# Patient Record
Sex: Female | Born: 1955 | Race: White | Hispanic: No | Marital: Single | Smoking: Never smoker
Health system: Southern US, Community
[De-identification: ages and names within clinical notes are randomized; demographics above are authoritative.]

## PROBLEM LIST (undated history)

## (undated) DIAGNOSIS — G119 Hereditary ataxia, unspecified: Secondary | ICD-10-CM

## (undated) DIAGNOSIS — J302 Other seasonal allergic rhinitis: Secondary | ICD-10-CM

## (undated) DIAGNOSIS — M419 Scoliosis, unspecified: Secondary | ICD-10-CM

## (undated) DIAGNOSIS — H3530 Unspecified macular degeneration: Secondary | ICD-10-CM

## (undated) DIAGNOSIS — I10 Essential (primary) hypertension: Secondary | ICD-10-CM

## (undated) DIAGNOSIS — G2581 Restless legs syndrome: Secondary | ICD-10-CM

## (undated) DIAGNOSIS — E039 Hypothyroidism, unspecified: Secondary | ICD-10-CM

## (undated) DIAGNOSIS — M1990 Unspecified osteoarthritis, unspecified site: Secondary | ICD-10-CM

## (undated) DIAGNOSIS — K7460 Unspecified cirrhosis of liver: Secondary | ICD-10-CM

## (undated) DIAGNOSIS — E785 Hyperlipidemia, unspecified: Secondary | ICD-10-CM

## (undated) HISTORY — DX: Hyperlipidemia, unspecified: E78.5

## (undated) HISTORY — DX: Unspecified cirrhosis of liver: K74.60

## (undated) HISTORY — DX: Scoliosis, unspecified: M41.9

## (undated) HISTORY — DX: Hereditary ataxia, unspecified: G11.9

## (undated) HISTORY — DX: Other seasonal allergic rhinitis: J30.2

## (undated) HISTORY — DX: Morbid (severe) obesity due to excess calories: E66.01

## (undated) HISTORY — DX: Unspecified macular degeneration: H35.30

## (undated) HISTORY — DX: Unspecified osteoarthritis, unspecified site: M19.90

## (undated) HISTORY — DX: Restless legs syndrome: G25.81

---

## 2004-03-31 ENCOUNTER — Ambulatory Visit: Payer: Self-pay | Admitting: Internal Medicine

## 2004-05-28 ENCOUNTER — Ambulatory Visit: Payer: Self-pay | Admitting: Internal Medicine

## 2004-07-24 ENCOUNTER — Ambulatory Visit: Payer: Self-pay | Admitting: Internal Medicine

## 2004-11-05 ENCOUNTER — Ambulatory Visit: Payer: Self-pay | Admitting: Internal Medicine

## 2004-12-31 ENCOUNTER — Ambulatory Visit: Payer: Self-pay | Admitting: Internal Medicine

## 2005-02-10 ENCOUNTER — Ambulatory Visit: Payer: Self-pay | Admitting: Internal Medicine

## 2005-05-05 ENCOUNTER — Ambulatory Visit: Payer: Self-pay | Admitting: Internal Medicine

## 2005-05-22 ENCOUNTER — Ambulatory Visit: Payer: Self-pay | Admitting: Internal Medicine

## 2005-08-03 ENCOUNTER — Ambulatory Visit: Payer: Self-pay | Admitting: Internal Medicine

## 2005-10-19 ENCOUNTER — Ambulatory Visit: Payer: Self-pay | Admitting: Internal Medicine

## 2005-12-09 ENCOUNTER — Ambulatory Visit: Payer: Self-pay | Admitting: Internal Medicine

## 2006-01-14 ENCOUNTER — Ambulatory Visit: Payer: Self-pay | Admitting: Internal Medicine

## 2006-01-22 ENCOUNTER — Ambulatory Visit: Payer: Self-pay | Admitting: Internal Medicine

## 2006-04-22 ENCOUNTER — Ambulatory Visit: Payer: Self-pay | Admitting: Internal Medicine

## 2006-04-22 LAB — CONVERTED CEMR LAB
ALT: 38 units/L (ref 0–40)
AST: 26 units/L (ref 0–37)
Albumin: 4.2 g/dL (ref 3.5–5.2)
Alkaline Phosphatase: 75 units/L (ref 39–117)
BUN: 15 mg/dL (ref 6–23)
Basophils Absolute: 0 10*3/uL (ref 0.0–0.1)
Basophils Relative: 0.1 % (ref 0.0–1.0)
CO2: 32 meq/L (ref 19–32)
Calcium: 9.8 mg/dL (ref 8.4–10.5)
Chloride: 97 meq/L (ref 96–112)
Chol/HDL Ratio, serum: 5.2
Cholesterol: 213 mg/dL (ref 0–200)
Creatinine, Ser: 0.9 mg/dL (ref 0.4–1.2)
Eosinophil percent: 3.7 % (ref 0.0–5.0)
GFR calc non Af Amer: 70 mL/min
Glomerular Filtration Rate, Af Am: 85 mL/min/{1.73_m2}
Glucose, Bld: 83 mg/dL (ref 70–99)
HCT: 41.5 % (ref 36.0–46.0)
HDL: 41 mg/dL (ref >39.0)
Hemoglobin: 14.1 g/dL (ref 12.0–15.0)
LDL DIRECT: 151.3 mg/dL
Lymphocytes Relative: 15.6 % (ref 12.0–46.0)
MCHC: 34 g/dL (ref 30.0–36.0)
MCV: 93.3 fL (ref 78.0–100.0)
Monocytes Absolute: 0.5 10*3/uL (ref 0.2–0.7)
Monocytes Relative: 7.2 % (ref 3.0–11.0)
Neutro Abs: 5.4 10*3/uL (ref 1.4–7.7)
Neutrophils Relative %: 73.4 % (ref 43.0–77.0)
Platelets: 263 10*3/uL (ref 150–400)
Potassium: 4.4 meq/L (ref 3.5–5.1)
RBC: 4.45 M/uL (ref 3.87–5.11)
RDW: 14.1 % (ref 11.5–14.6)
Sodium: 140 meq/L (ref 135–145)
Total Bilirubin: 0.6 mg/dL (ref 0.3–1.2)
Total Protein: 7.1 g/dL (ref 6.0–8.3)
Triglyceride fasting, serum: 134 mg/dL (ref 0–149)
VLDL: 27 mg/dL (ref 0–40)
WBC: 7.4 10*3/uL (ref 4.5–10.5)

## 2006-07-14 ENCOUNTER — Ambulatory Visit: Payer: Self-pay | Admitting: Internal Medicine

## 2006-07-21 ENCOUNTER — Encounter: Admission: RE | Admit: 2006-07-21 | Discharge: 2006-09-15 | Payer: Self-pay | Admitting: Internal Medicine

## 2007-08-29 ENCOUNTER — Ambulatory Visit (HOSPITAL_COMMUNITY): Admission: RE | Admit: 2007-08-29 | Discharge: 2007-08-29 | Payer: Self-pay | Admitting: Internal Medicine

## 2007-10-11 ENCOUNTER — Encounter: Admission: RE | Admit: 2007-10-11 | Discharge: 2007-10-11 | Payer: Self-pay | Admitting: Internal Medicine

## 2008-11-26 ENCOUNTER — Ambulatory Visit (HOSPITAL_COMMUNITY): Admission: RE | Admit: 2008-11-26 | Discharge: 2008-11-26 | Payer: Self-pay | Admitting: Internal Medicine

## 2009-11-28 ENCOUNTER — Ambulatory Visit (HOSPITAL_COMMUNITY): Admission: RE | Admit: 2009-11-28 | Discharge: 2009-11-28 | Payer: Self-pay | Admitting: Internal Medicine

## 2010-11-27 ENCOUNTER — Other Ambulatory Visit (HOSPITAL_COMMUNITY): Payer: Self-pay | Admitting: Internal Medicine

## 2010-11-27 DIAGNOSIS — Z1231 Encounter for screening mammogram for malignant neoplasm of breast: Secondary | ICD-10-CM

## 2010-12-10 ENCOUNTER — Ambulatory Visit (HOSPITAL_COMMUNITY)
Admission: RE | Admit: 2010-12-10 | Discharge: 2010-12-10 | Disposition: A | Discharge status: Home / Self Care | Payer: Medicare Other | Source: Ambulatory Visit | Attending: Internal Medicine | Admitting: Internal Medicine

## 2010-12-10 DIAGNOSIS — Z1231 Encounter for screening mammogram for malignant neoplasm of breast: Secondary | ICD-10-CM | POA: Insufficient documentation

## 2011-04-22 DIAGNOSIS — G119 Hereditary ataxia, unspecified: Secondary | ICD-10-CM | POA: Diagnosis not present

## 2011-05-22 DIAGNOSIS — H35349 Macular cyst, hole, or pseudohole, unspecified eye: Secondary | ICD-10-CM | POA: Diagnosis not present

## 2011-08-05 DIAGNOSIS — E039 Hypothyroidism, unspecified: Secondary | ICD-10-CM | POA: Diagnosis not present

## 2011-08-05 DIAGNOSIS — E7800 Pure hypercholesterolemia, unspecified: Secondary | ICD-10-CM | POA: Diagnosis not present

## 2011-08-10 DIAGNOSIS — IMO0001 Reserved for inherently not codable concepts without codable children: Secondary | ICD-10-CM | POA: Diagnosis not present

## 2011-08-10 DIAGNOSIS — M79609 Pain in unspecified limb: Secondary | ICD-10-CM | POA: Diagnosis not present

## 2011-08-10 DIAGNOSIS — E039 Hypothyroidism, unspecified: Secondary | ICD-10-CM | POA: Diagnosis not present

## 2011-08-24 DIAGNOSIS — M79609 Pain in unspecified limb: Secondary | ICD-10-CM | POA: Diagnosis not present

## 2011-08-24 DIAGNOSIS — M719 Bursopathy, unspecified: Secondary | ICD-10-CM | POA: Diagnosis not present

## 2011-09-02 ENCOUNTER — Ambulatory Visit: Payer: Medicare Other | Attending: Internal Medicine | Admitting: Physical Therapy

## 2011-09-02 DIAGNOSIS — M25619 Stiffness of unspecified shoulder, not elsewhere classified: Secondary | ICD-10-CM | POA: Diagnosis not present

## 2011-09-02 DIAGNOSIS — M25519 Pain in unspecified shoulder: Secondary | ICD-10-CM | POA: Insufficient documentation

## 2011-09-02 DIAGNOSIS — IMO0001 Reserved for inherently not codable concepts without codable children: Secondary | ICD-10-CM | POA: Diagnosis not present

## 2011-09-09 ENCOUNTER — Encounter: Payer: Medicare Other | Admitting: Physical Therapy

## 2011-09-10 ENCOUNTER — Encounter: Payer: Medicare Other | Admitting: Rehabilitation

## 2011-11-03 DIAGNOSIS — M79609 Pain in unspecified limb: Secondary | ICD-10-CM | POA: Diagnosis not present

## 2011-11-03 DIAGNOSIS — M67919 Unspecified disorder of synovium and tendon, unspecified shoulder: Secondary | ICD-10-CM | POA: Diagnosis not present

## 2011-11-03 DIAGNOSIS — M719 Bursopathy, unspecified: Secondary | ICD-10-CM | POA: Diagnosis not present

## 2011-11-04 DIAGNOSIS — E039 Hypothyroidism, unspecified: Secondary | ICD-10-CM | POA: Diagnosis not present

## 2011-11-11 DIAGNOSIS — I10 Essential (primary) hypertension: Secondary | ICD-10-CM | POA: Diagnosis not present

## 2011-11-11 DIAGNOSIS — E039 Hypothyroidism, unspecified: Secondary | ICD-10-CM | POA: Diagnosis not present

## 2011-11-11 DIAGNOSIS — M79609 Pain in unspecified limb: Secondary | ICD-10-CM | POA: Diagnosis not present

## 2011-11-24 DIAGNOSIS — H35349 Macular cyst, hole, or pseudohole, unspecified eye: Secondary | ICD-10-CM | POA: Diagnosis not present

## 2011-12-08 DIAGNOSIS — H3560 Retinal hemorrhage, unspecified eye: Secondary | ICD-10-CM | POA: Diagnosis not present

## 2011-12-08 DIAGNOSIS — H35349 Macular cyst, hole, or pseudohole, unspecified eye: Secondary | ICD-10-CM | POA: Diagnosis not present

## 2011-12-08 DIAGNOSIS — H43819 Vitreous degeneration, unspecified eye: Secondary | ICD-10-CM | POA: Diagnosis not present

## 2011-12-08 DIAGNOSIS — H2510 Age-related nuclear cataract, unspecified eye: Secondary | ICD-10-CM | POA: Diagnosis not present

## 2011-12-23 DIAGNOSIS — Z23 Encounter for immunization: Secondary | ICD-10-CM | POA: Diagnosis not present

## 2012-01-25 DIAGNOSIS — H35349 Macular cyst, hole, or pseudohole, unspecified eye: Secondary | ICD-10-CM | POA: Diagnosis not present

## 2012-01-26 DIAGNOSIS — H35349 Macular cyst, hole, or pseudohole, unspecified eye: Secondary | ICD-10-CM | POA: Diagnosis not present

## 2012-03-03 DIAGNOSIS — I10 Essential (primary) hypertension: Secondary | ICD-10-CM | POA: Diagnosis not present

## 2012-03-03 DIAGNOSIS — E039 Hypothyroidism, unspecified: Secondary | ICD-10-CM | POA: Diagnosis not present

## 2012-03-16 DIAGNOSIS — H35349 Macular cyst, hole, or pseudohole, unspecified eye: Secondary | ICD-10-CM | POA: Diagnosis not present

## 2012-04-18 DIAGNOSIS — M79609 Pain in unspecified limb: Secondary | ICD-10-CM | POA: Diagnosis not present

## 2012-04-18 DIAGNOSIS — I10 Essential (primary) hypertension: Secondary | ICD-10-CM | POA: Diagnosis not present

## 2012-04-18 DIAGNOSIS — E039 Hypothyroidism, unspecified: Secondary | ICD-10-CM | POA: Diagnosis not present

## 2012-05-06 DIAGNOSIS — H2510 Age-related nuclear cataract, unspecified eye: Secondary | ICD-10-CM | POA: Diagnosis not present

## 2012-10-11 DIAGNOSIS — I10 Essential (primary) hypertension: Secondary | ICD-10-CM | POA: Diagnosis not present

## 2012-10-11 DIAGNOSIS — E039 Hypothyroidism, unspecified: Secondary | ICD-10-CM | POA: Diagnosis not present

## 2012-10-25 DIAGNOSIS — E039 Hypothyroidism, unspecified: Secondary | ICD-10-CM | POA: Diagnosis not present

## 2012-10-25 DIAGNOSIS — J309 Allergic rhinitis, unspecified: Secondary | ICD-10-CM | POA: Diagnosis not present

## 2012-10-25 DIAGNOSIS — I10 Essential (primary) hypertension: Secondary | ICD-10-CM | POA: Diagnosis not present

## 2012-11-08 DIAGNOSIS — H2510 Age-related nuclear cataract, unspecified eye: Secondary | ICD-10-CM | POA: Diagnosis not present

## 2012-12-30 DIAGNOSIS — Z23 Encounter for immunization: Secondary | ICD-10-CM | POA: Diagnosis not present

## 2013-03-03 ENCOUNTER — Emergency Department: Payer: Self-pay | Admitting: Internal Medicine

## 2013-03-03 DIAGNOSIS — G1111 Friedreich ataxia: Secondary | ICD-10-CM | POA: Diagnosis not present

## 2013-03-03 DIAGNOSIS — I998 Other disorder of circulatory system: Secondary | ICD-10-CM | POA: Diagnosis not present

## 2013-03-03 LAB — APTT: Activated PTT: 31.6 secs (ref 23.6–35.9)

## 2013-03-03 LAB — CBC
MCH: 33.4 pg (ref 26.0–34.0)
MCV: 97 fL (ref 80–100)
RBC: 4.38 10*6/uL (ref 3.80–5.20)

## 2013-04-27 DIAGNOSIS — E039 Hypothyroidism, unspecified: Secondary | ICD-10-CM | POA: Diagnosis not present

## 2013-04-27 DIAGNOSIS — I10 Essential (primary) hypertension: Secondary | ICD-10-CM | POA: Diagnosis not present

## 2013-05-04 DIAGNOSIS — E7800 Pure hypercholesterolemia, unspecified: Secondary | ICD-10-CM | POA: Diagnosis not present

## 2013-05-04 DIAGNOSIS — E039 Hypothyroidism, unspecified: Secondary | ICD-10-CM | POA: Diagnosis not present

## 2013-05-04 DIAGNOSIS — I10 Essential (primary) hypertension: Secondary | ICD-10-CM | POA: Diagnosis not present

## 2013-10-25 DIAGNOSIS — E039 Hypothyroidism, unspecified: Secondary | ICD-10-CM | POA: Diagnosis not present

## 2013-10-25 DIAGNOSIS — I10 Essential (primary) hypertension: Secondary | ICD-10-CM | POA: Diagnosis not present

## 2013-11-08 DIAGNOSIS — K769 Liver disease, unspecified: Secondary | ICD-10-CM | POA: Diagnosis not present

## 2013-11-08 DIAGNOSIS — I10 Essential (primary) hypertension: Secondary | ICD-10-CM | POA: Diagnosis not present

## 2013-11-08 DIAGNOSIS — E039 Hypothyroidism, unspecified: Secondary | ICD-10-CM | POA: Diagnosis not present

## 2013-11-08 DIAGNOSIS — M25569 Pain in unspecified knee: Secondary | ICD-10-CM | POA: Diagnosis not present

## 2013-11-17 DIAGNOSIS — H2510 Age-related nuclear cataract, unspecified eye: Secondary | ICD-10-CM | POA: Diagnosis not present

## 2013-11-17 DIAGNOSIS — H353190 Nonexudative age-related macular degeneration, unspecified eye, stage unspecified: Secondary | ICD-10-CM | POA: Diagnosis not present

## 2014-01-02 DIAGNOSIS — E039 Hypothyroidism, unspecified: Secondary | ICD-10-CM | POA: Diagnosis not present

## 2014-01-16 DIAGNOSIS — E039 Hypothyroidism, unspecified: Secondary | ICD-10-CM | POA: Diagnosis not present

## 2014-01-16 DIAGNOSIS — Z23 Encounter for immunization: Secondary | ICD-10-CM | POA: Diagnosis not present

## 2014-01-16 DIAGNOSIS — I10 Essential (primary) hypertension: Secondary | ICD-10-CM | POA: Diagnosis not present

## 2014-01-23 DIAGNOSIS — M25561 Pain in right knee: Secondary | ICD-10-CM | POA: Diagnosis not present

## 2014-01-23 DIAGNOSIS — M179 Osteoarthritis of knee, unspecified: Secondary | ICD-10-CM | POA: Diagnosis not present

## 2014-08-28 ENCOUNTER — Other Ambulatory Visit: Payer: Self-pay | Admitting: Internal Medicine

## 2014-08-28 DIAGNOSIS — R945 Abnormal results of liver function studies: Secondary | ICD-10-CM

## 2014-09-18 ENCOUNTER — Ambulatory Visit
Admission: RE | Admit: 2014-09-18 | Discharge: 2014-09-18 | Disposition: A | Discharge status: Home / Self Care | Payer: Medicare Other | Source: Ambulatory Visit | Attending: Internal Medicine | Admitting: Internal Medicine

## 2014-09-18 DIAGNOSIS — R160 Hepatomegaly, not elsewhere classified: Secondary | ICD-10-CM | POA: Diagnosis not present

## 2014-09-18 DIAGNOSIS — R945 Abnormal results of liver function studies: Secondary | ICD-10-CM | POA: Diagnosis present

## 2014-10-03 ENCOUNTER — Ambulatory Visit: Payer: Medicare Other | Admitting: Cardiovascular Disease

## 2014-11-09 ENCOUNTER — Ambulatory Visit: Payer: Medicare Other | Admitting: Cardiovascular Disease

## 2016-04-22 DIAGNOSIS — R0601 Orthopnea: Secondary | ICD-10-CM | POA: Diagnosis not present

## 2016-04-22 DIAGNOSIS — R270 Ataxia, unspecified: Secondary | ICD-10-CM | POA: Diagnosis not present

## 2016-04-22 DIAGNOSIS — Q043 Other reduction deformities of brain: Secondary | ICD-10-CM | POA: Diagnosis not present

## 2016-04-22 DIAGNOSIS — R42 Dizziness and giddiness: Secondary | ICD-10-CM | POA: Diagnosis not present

## 2016-05-23 DIAGNOSIS — R270 Ataxia, unspecified: Secondary | ICD-10-CM | POA: Diagnosis not present

## 2016-05-23 DIAGNOSIS — Q043 Other reduction deformities of brain: Secondary | ICD-10-CM | POA: Diagnosis not present

## 2016-05-23 DIAGNOSIS — R42 Dizziness and giddiness: Secondary | ICD-10-CM | POA: Diagnosis not present

## 2016-05-23 DIAGNOSIS — R0601 Orthopnea: Secondary | ICD-10-CM | POA: Diagnosis not present

## 2016-06-08 DIAGNOSIS — Z6841 Body Mass Index (BMI) 40.0 and over, adult: Secondary | ICD-10-CM | POA: Diagnosis not present

## 2016-06-08 DIAGNOSIS — G119 Hereditary ataxia, unspecified: Secondary | ICD-10-CM | POA: Diagnosis not present

## 2016-06-20 DIAGNOSIS — R0601 Orthopnea: Secondary | ICD-10-CM | POA: Diagnosis not present

## 2016-06-20 DIAGNOSIS — Q043 Other reduction deformities of brain: Secondary | ICD-10-CM | POA: Diagnosis not present

## 2016-06-20 DIAGNOSIS — R42 Dizziness and giddiness: Secondary | ICD-10-CM | POA: Diagnosis not present

## 2016-06-20 DIAGNOSIS — R270 Ataxia, unspecified: Secondary | ICD-10-CM | POA: Diagnosis not present

## 2016-07-21 DIAGNOSIS — R0601 Orthopnea: Secondary | ICD-10-CM | POA: Diagnosis not present

## 2016-07-21 DIAGNOSIS — Q043 Other reduction deformities of brain: Secondary | ICD-10-CM | POA: Diagnosis not present

## 2016-07-21 DIAGNOSIS — R42 Dizziness and giddiness: Secondary | ICD-10-CM | POA: Diagnosis not present

## 2016-07-21 DIAGNOSIS — R270 Ataxia, unspecified: Secondary | ICD-10-CM | POA: Diagnosis not present

## 2016-07-22 ENCOUNTER — Emergency Department: Payer: PPO

## 2016-07-22 ENCOUNTER — Encounter: Payer: Self-pay | Admitting: Emergency Medicine

## 2016-07-22 ENCOUNTER — Emergency Department
Admission: EM | Admit: 2016-07-22 | Discharge: 2016-07-23 | Disposition: A | Discharge status: Home / Self Care | Payer: PPO | Attending: Emergency Medicine | Admitting: Emergency Medicine

## 2016-07-22 DIAGNOSIS — S3991XA Unspecified injury of abdomen, initial encounter: Secondary | ICD-10-CM | POA: Diagnosis not present

## 2016-07-22 DIAGNOSIS — Y939 Activity, unspecified: Secondary | ICD-10-CM | POA: Insufficient documentation

## 2016-07-22 DIAGNOSIS — S300XXA Contusion of lower back and pelvis, initial encounter: Secondary | ICD-10-CM | POA: Insufficient documentation

## 2016-07-22 DIAGNOSIS — R0602 Shortness of breath: Secondary | ICD-10-CM | POA: Diagnosis not present

## 2016-07-22 DIAGNOSIS — E039 Hypothyroidism, unspecified: Secondary | ICD-10-CM | POA: Insufficient documentation

## 2016-07-22 DIAGNOSIS — Y92002 Bathroom of unspecified non-institutional (private) residence single-family (private) house as the place of occurrence of the external cause: Secondary | ICD-10-CM | POA: Diagnosis not present

## 2016-07-22 DIAGNOSIS — M25551 Pain in right hip: Secondary | ICD-10-CM | POA: Diagnosis not present

## 2016-07-22 DIAGNOSIS — I10 Essential (primary) hypertension: Secondary | ICD-10-CM | POA: Insufficient documentation

## 2016-07-22 DIAGNOSIS — R270 Ataxia, unspecified: Secondary | ICD-10-CM | POA: Diagnosis not present

## 2016-07-22 DIAGNOSIS — W19XXXA Unspecified fall, initial encounter: Secondary | ICD-10-CM

## 2016-07-22 DIAGNOSIS — S3992XA Unspecified injury of lower back, initial encounter: Secondary | ICD-10-CM | POA: Diagnosis not present

## 2016-07-22 DIAGNOSIS — W1839XA Other fall on same level, initial encounter: Secondary | ICD-10-CM | POA: Insufficient documentation

## 2016-07-22 DIAGNOSIS — Y999 Unspecified external cause status: Secondary | ICD-10-CM | POA: Insufficient documentation

## 2016-07-22 DIAGNOSIS — R000 Tachycardia, unspecified: Secondary | ICD-10-CM | POA: Diagnosis not present

## 2016-07-22 DIAGNOSIS — T148XXA Other injury of unspecified body region, initial encounter: Secondary | ICD-10-CM | POA: Diagnosis not present

## 2016-07-22 DIAGNOSIS — R296 Repeated falls: Secondary | ICD-10-CM | POA: Diagnosis not present

## 2016-07-22 DIAGNOSIS — Z79899 Other long term (current) drug therapy: Secondary | ICD-10-CM | POA: Insufficient documentation

## 2016-07-22 HISTORY — DX: Hypothyroidism, unspecified: E03.9

## 2016-07-22 HISTORY — DX: Essential (primary) hypertension: I10

## 2016-07-22 LAB — TROPONIN I

## 2016-07-22 LAB — URINALYSIS, COMPLETE (UACMP) WITH MICROSCOPIC
BILIRUBIN URINE: NEGATIVE
Bacteria, UA: NONE SEEN
GLUCOSE, UA: NEGATIVE mg/dL
Hgb urine dipstick: NEGATIVE
Ketones, ur: NEGATIVE mg/dL
LEUKOCYTES UA: NEGATIVE
Nitrite: NEGATIVE
PH: 6 (ref 5.0–8.0)
Protein, ur: NEGATIVE mg/dL
SQUAMOUS EPITHELIAL / LPF: NONE SEEN

## 2016-07-22 LAB — BASIC METABOLIC PANEL
ANION GAP: 8 (ref 5–15)
BUN: 17 mg/dL (ref 6–20)
CHLORIDE: 103 mmol/L (ref 101–111)
CO2: 26 mmol/L (ref 22–32)
Calcium: 8.9 mg/dL (ref 8.9–10.3)
Creatinine, Ser: 1.06 mg/dL — ABNORMAL HIGH (ref 0.44–1.00)
GFR, EST NON AFRICAN AMERICAN: 55 mL/min — AB (ref >60)
Glucose, Bld: 125 mg/dL — ABNORMAL HIGH (ref 65–99)
POTASSIUM: 3.9 mmol/L (ref 3.5–5.1)
SODIUM: 137 mmol/L (ref 135–145)

## 2016-07-22 LAB — CBC
HCT: 36.3 % (ref 35.0–47.0)
HEMOGLOBIN: 12.5 g/dL (ref 12.0–16.0)
MCH: 33.9 pg (ref 26.0–34.0)
MCHC: 34.5 g/dL (ref 32.0–36.0)
MCV: 98.4 fL (ref 80.0–100.0)
PLATELETS: 178 10*3/uL (ref 150–440)
RBC: 3.69 MIL/uL — AB (ref 3.80–5.20)
RDW: 14.1 % (ref 11.5–14.5)
WBC: 9.1 10*3/uL (ref 3.6–11.0)

## 2016-07-22 MED ORDER — LORAZEPAM 2 MG/ML IJ SOLN
1.0000 mg | Freq: Once | INTRAMUSCULAR | Status: AC
  Administered 2016-07-22: 1 mg via INTRAVENOUS
  Filled 2016-07-22: qty 1

## 2016-07-22 MED ORDER — SODIUM CHLORIDE 0.9 % IV BOLUS (SEPSIS)
1000.0000 mL | Freq: Once | INTRAVENOUS | Status: AC
  Administered 2016-07-22: 1000 mL via INTRAVENOUS

## 2016-07-22 MED ORDER — ACETAMINOPHEN 500 MG PO TABS: 1000.0000 mg | ORAL_TABLET | Freq: Three times a day (TID) | ORAL | Status: DC | PRN

## 2016-07-22 MED ORDER — LOSARTAN POTASSIUM 50 MG PO TABS
100.0000 mg | ORAL_TABLET | Freq: Every day | ORAL | Status: DC
  Administered 2016-07-23: 100 mg via ORAL
  Filled 2016-07-22: qty 2

## 2016-07-22 MED ORDER — LEVOTHYROXINE SODIUM 112 MCG PO TABS
112.0000 ug | ORAL_TABLET | Freq: Every day | ORAL | Status: DC
  Administered 2016-07-23: 112 ug via ORAL
  Filled 2016-07-22: qty 1

## 2016-07-22 MED ORDER — IOPAMIDOL (ISOVUE-300) INJECTION 61%
100.0000 mL | Freq: Once | INTRAVENOUS | Status: AC | PRN
  Administered 2016-07-22: 100 mL via INTRAVENOUS

## 2016-07-22 MED ORDER — ONDANSETRON HCL 4 MG/2ML IJ SOLN
4.0000 mg | Freq: Once | INTRAMUSCULAR | Status: AC
  Administered 2016-07-22: 4 mg via INTRAVENOUS
  Filled 2016-07-22: qty 2

## 2016-07-22 MED ORDER — HYDROCHLOROTHIAZIDE 25 MG PO TABS
50.0000 mg | ORAL_TABLET | Freq: Every day | ORAL | Status: DC
  Administered 2016-07-23: 50 mg via ORAL
  Filled 2016-07-22: qty 2

## 2016-07-22 NOTE — ED Provider Notes (Signed)
[facility] Emergency Department Provider Note  ____________________________________________  Time seen: Approximately 3:21 PM  I have reviewed the triage vital signs and the nursing notes.   HISTORY  Chief Complaint Failure To Thrive   HPI Olivia Butler is a 61 y.o. female with a history of Hashimoto thyroiditis and degenerative cerebellar ataxia who presents for evaluation of a fall. Patient reports she lives alone. Her ataxia is been getting progressively worse over the course of months. She has a walker and a wheelchair but has had increased number of falls. She reports that she has been falling every day. Today she had a fall in the bathroom where she fell onto her buttock. She was able to get herself into the wheelchair. She then went to the senior center and upon getting there she started feeling worsening pain. Patient is concerned that she will seriously hurt herself pretty soon and does not feel safe leaving at home by herself anymore.Patient has no family. Patient denies head trauma, neck pain, back pain, chest pain, abdominal pain, extremity pain.   Past Medical History:  Diagnosis Date  . Hypertension   . Hypothyroidism     There are no active problems to display for this patient.   No past surgical history on file.  Prior to Admission medications   Not on File    Allergies Patient has no known allergies.  No family history on file.  Social History Social History  Substance Use Topics  . Smoking status: Never Smoker  . Smokeless tobacco: Never Used  . Alcohol use No    Review of Systems Constitutional: Negative for fever. Eyes: Negative for visual changes. ENT: Negative for facial injury or neck injury Cardiovascular: Negative for chest injury. Respiratory: Negative for shortness of breath. Negative for chest wall injury. Gastrointestinal: Negative for abdominal pain or injury. Genitourinary: Negative for  dysuria. Musculoskeletal: Negative for back injury, negative for arm or leg pain. + R buttock pain Skin: Negative for laceration/abrasions. Neurological: Negative for head injury. + ataxia   ____________________________________________   PHYSICAL EXAM:  VITAL SIGNS: ED Triage Vitals  Enc Vitals Group     BP --      Pulse Rate 07/22/16 1519 (!) 106     Resp 07/22/16 1519 16     Temp 07/22/16 1519 97.6 F (36.4 C)     Temp Source 07/22/16 1519 Oral     SpO2 --      Weight 07/22/16 1521 285 lb (129.3 kg)     Height 07/22/16 1521  (1.702 m)     Head Circumference --      Peak Flow --      Pain Score 07/22/16 1519 10     Pain Loc --      Pain Edu? --      Excl. in GC? --    Constitutional: Alert and oriented. No acute distress. Does not appear intoxicated. HEENT Head: Normocephalic and atraumatic. Face: No facial bony tenderness. Stable midface Ears: No hemotympanum bilaterally. No Battle sign Eyes: No eye injury. PERRL. No raccoon eyes Nose: Nontender. No epistaxis. No rhinorrhea Mouth/Throat: Mucous membranes are moist. No oropharyngeal blood. No dental injury. Airway patent without stridor. Normal voice. Neck: no C-collar in place. No midline c-spine tenderness.  Cardiovascular: Normal rate, regular rhythm. Normal and symmetric distal pulses are present in all extremities. Pulmonary/Chest: Chest wall is stable and nontender to palpation/compression. Normal respiratory effort. Breath sounds are normal. No crepitus.  Abdominal: Soft, nontender,  non distended. Musculoskeletal: Large R sided buttock hematoma and swelling with significant ttp. Nontender with normal full range of motion in all extremities. No deformities. No thoracic or lumbar midline spinal tenderness. Pelvis is stable. Skin: Skin is warm, dry and intact. No abrasions or contutions. Psychiatric: Speech and behavior are appropriate. Neurological: Normal speech and language. Moves all extremities to command.  No gross focal neurologic deficits are appreciated.  Glascow Coma Score: 4 - Opens eyes on own 6 - Follows simple motor commands 5 - Alert and oriented GCS: 15   ____________________________________________   LABS (all labs ordered are listed, but only abnormal results are displayed)  Labs Reviewed  CBC - Abnormal; Notable for the following:       Result Value   RBC 3.69 (*)    All other components within normal limits  BASIC METABOLIC PANEL - Abnormal; Notable for the following:    Glucose, Bld 125 (*)    Creatinine, Ser 1.06 (*)    GFR calc non Af Amer 55 (*)    All other components within normal limits  URINALYSIS, COMPLETE (UACMP) WITH MICROSCOPIC - Abnormal; Notable for the following:    Color, Urine YELLOW (*)    APPearance CLEAR (*)    Specific Gravity, Urine >1.046 (*)    All other components within normal limits  TROPONIN I   ____________________________________________  EKG  ED ECG REPORT I, Nita Sickle, the attending physician, personally viewed and interpreted this ECG.   Sinus tachycardia, rate of 103, normal intervals, normal axis, no ST elevations or depressions. No prior for comparison ____________________________________________  RADIOLOGY  CT head: Generalized atrophy particularly of the cerebellum. Minimal small vessel chronic ischemic changes of deep cerebral white matter. No acute intracranial abnormalities.  CT a/p: CT ABDOMEN AND PELVIS IMPRESSION: 1. 5.8 x 10.3 x 10.0 cm soft tissue hematoma within the subcutaneous fat of the right gluteal region. 2. No other acute traumatic injury within the abdomen and pelvis. 3. Cirrhosis with evidence of portal hypertension. No ascites. 4. Mildly enlarged intra-abdominal and pelvic adenopathy as above, indeterminate, but may be reactive. 5. Cholelithiasis. 6. Bilateral nonobstructive nephrolithiasis. 7. Moderate aorto bi-iliac atherosclerotic disease. 8. Fat containing paraumbilical hernia  without associated inflammation.  CT LUMBAR SPINE IMPRESSION: 1. No acute traumatic injury within the lumbar spine. 2. Scoliosis with mild multilevel degenerative spondylolysis as above.  CXR: No edema or consolidation. _______________________________________   PROCEDURES  Procedure(s) performed: None Procedures Critical Care performed:  None ____________________________________________   INITIAL IMPRESSION / ASSESSMENT AND PLAN / ED COURSE  61 y.o. female with a history of Hashimoto thyroiditis and degenerative cerebellar ataxia who presents for evaluation of increasing fall frequency due to her ataxia. Patient lives alone. Has walker and wheelchair. Complaining of R buttock region where patient has a very large hematoma. Will get head CT, CT pelvis, CT lumbar spine. Will check basic labs. Presentation probably due to progression of her chronic illness however will eval labs and imaging to rule out alternative diagnosis leading to increasing fall frequency.   Clinical Course as of Jul 23 1815  Wed Jul 22, 2016  1812 Imaging showing a large buttock hematoma with no active bleeding. No other acute injuries sustained during the fall. Blood work within normal limits showing mildly elevated creatinine. Patient was given IV fluids. I discussed patient with social work and based on patient's insurance she may qualify for placement out of the emergency department. They recommended physical therapy evaluation which will happen in the morning. Patient  will wait for those evaluations in the emergency department.  [CV]    Clinical Course User Index [CV] Nita Sickle, MD    Pertinent labs & imaging results that were available during my care of the patient were reviewed by me and considered in my medical decision making (see chart for details).    ____________________________________________   FINAL CLINICAL IMPRESSION(S) / ED DIAGNOSES  Final diagnoses:  Fall, initial encounter   Traumatic hematoma of buttock, initial encounter      NEW MEDICATIONS STARTED DURING THIS VISIT:  New Prescriptions   No medications on file     Note:  This document was prepared using Dragon voice recognition software and may include unintentional dictation errors.    Nita Sickle, MD 07/22/16 [phone]

## 2016-07-22 NOTE — ED Notes (Signed)
Hooked pt up to monitor.

## 2016-07-22 NOTE — ED Notes (Signed)
Patient unable to tolerate CT at this time. See orders. Will place patient on 2L [place] O2 for comfort.

## 2016-07-22 NOTE — ED Notes (Signed)
Gave pt food tray and ginger ale 

## 2016-07-22 NOTE — ED Notes (Signed)
Patient transported to X-ray 

## 2016-07-22 NOTE — ED Triage Notes (Signed)
Per [facility] patient comes from home. Patient lives alone. Patient here for frequent falls, SOB and dizziness x 1 month. A&O x4.

## 2016-07-22 NOTE — ED Notes (Signed)
Gave pt a blanket

## 2016-07-22 NOTE — ED Notes (Signed)
Patient transported to CT 

## 2016-07-23 DIAGNOSIS — E7800 Pure hypercholesterolemia, unspecified: Secondary | ICD-10-CM | POA: Diagnosis not present

## 2016-07-23 DIAGNOSIS — G20 Parkinson's disease: Secondary | ICD-10-CM | POA: Diagnosis not present

## 2016-07-23 DIAGNOSIS — M419 Scoliosis, unspecified: Secondary | ICD-10-CM | POA: Diagnosis not present

## 2016-07-23 DIAGNOSIS — G3281 Cerebellar ataxia in diseases classified elsewhere: Secondary | ICD-10-CM | POA: Diagnosis not present

## 2016-07-23 DIAGNOSIS — K7460 Unspecified cirrhosis of liver: Secondary | ICD-10-CM | POA: Diagnosis not present

## 2016-07-23 DIAGNOSIS — S300XXA Contusion of lower back and pelvis, initial encounter: Secondary | ICD-10-CM | POA: Diagnosis not present

## 2016-07-23 DIAGNOSIS — E063 Autoimmune thyroiditis: Secondary | ICD-10-CM | POA: Diagnosis not present

## 2016-07-23 DIAGNOSIS — K7469 Other cirrhosis of liver: Secondary | ICD-10-CM | POA: Diagnosis not present

## 2016-07-23 DIAGNOSIS — H3530 Unspecified macular degeneration: Secondary | ICD-10-CM | POA: Diagnosis not present

## 2016-07-23 DIAGNOSIS — Z7401 Bed confinement status: Secondary | ICD-10-CM | POA: Diagnosis not present

## 2016-07-23 DIAGNOSIS — W19XXXD Unspecified fall, subsequent encounter: Secondary | ICD-10-CM | POA: Diagnosis not present

## 2016-07-23 DIAGNOSIS — M4309 Spondylolysis, multiple sites in spine: Secondary | ICD-10-CM | POA: Diagnosis not present

## 2016-07-23 DIAGNOSIS — E038 Other specified hypothyroidism: Secondary | ICD-10-CM | POA: Diagnosis not present

## 2016-07-23 DIAGNOSIS — R6889 Other general symptoms and signs: Secondary | ICD-10-CM | POA: Diagnosis not present

## 2016-07-23 DIAGNOSIS — G119 Hereditary ataxia, unspecified: Secondary | ICD-10-CM | POA: Diagnosis not present

## 2016-07-23 DIAGNOSIS — R296 Repeated falls: Secondary | ICD-10-CM | POA: Diagnosis not present

## 2016-07-23 DIAGNOSIS — S300XXD Contusion of lower back and pelvis, subsequent encounter: Secondary | ICD-10-CM | POA: Diagnosis not present

## 2016-07-23 DIAGNOSIS — I10 Essential (primary) hypertension: Secondary | ICD-10-CM | POA: Diagnosis not present

## 2016-07-23 DIAGNOSIS — R627 Adult failure to thrive: Secondary | ICD-10-CM | POA: Diagnosis not present

## 2016-07-23 DIAGNOSIS — M1712 Unilateral primary osteoarthritis, left knee: Secondary | ICD-10-CM | POA: Diagnosis not present

## 2016-07-23 MED ORDER — LORAZEPAM 0.5 MG PO TABS
0.5000 mg | ORAL_TABLET | Freq: Once | ORAL | Status: AC
  Administered 2016-07-23: 0.5 mg via ORAL

## 2016-07-23 MED ORDER — LORAZEPAM 0.5 MG PO TABS
ORAL_TABLET | ORAL | Status: AC
  Administered 2016-07-23: 0.5 mg via ORAL
  Filled 2016-07-23: qty 1

## 2016-07-23 NOTE — ED Notes (Signed)
Pt placed on bedpan

## 2016-07-23 NOTE — ED Provider Notes (Signed)
Patient accepted at [facility]  Current Facility-Administered Medications:  .  acetaminophen (TYLENOL) tablet 1,000 mg, 1,000 mg, Oral, Q8H PRN, Nita Sickle, MD .  hydrochlorothiazide (HYDRODIURIL) tablet 50 mg, 50 mg, Oral, Daily, Nita Sickle, MD, 50 mg at 07/23/16 1053 .  levothyroxine (SYNTHROID, LEVOTHROID) tablet 112 mcg, 112 mcg, Oral, QAC breakfast, Nita Sickle, MD, 112 mcg at 07/23/16 0835 .  losartan (COZAAR) tablet 100 mg, 100 mg, Oral, Daily, Nita Sickle, MD, 100 mg at 07/23/16 1053  Current Outpatient Prescriptions:  .  hydrochlorothiazide (HYDRODIURIL) 25 MG tablet, Take 50 mg by mouth daily., Disp: , Rfl:  .  levothyroxine (SYNTHROID, LEVOTHROID) 112 MCG tablet, Take 112 mcg by mouth daily., Disp: , Rfl:  .  losartan (COZAAR) 100 MG tablet, Take 100 mg by mouth daily., Disp: , Rfl:  .  naproxen (NAPROSYN) 500 MG tablet, Take 500 mg by mouth 2 (two) times daily with a meal., Disp: , Rfl:  .  prochlorperazine (COMPAZINE) 10 MG tablet, Take 10 mg by mouth every 8 (eight) hours as needed for nausea., Disp: , Rfl:  .  rOPINIRole (REQUIP) 1 MG tablet, Take 1 mg by mouth 2 (two) times daily., Disp: , Rfl:  .  vitamin E 400 UNIT capsule, Take 400 Units by mouth daily., Disp: , Rfl:    She is stable for discharge   Emily Filbert, MD 07/23/16 1531

## 2016-07-23 NOTE — Clinical Social Work Note (Signed)
CSW consulted for New SNF. CSW met  with pt to address consult. CSW introduced herself and explained role of social work. CSW also explained explained the process of discharging to SNF. Pt is agreeable to STR SNF placement, as recommending by PT. SNF search has been initiated. Will follow up with bed offers. Will initiate insurance auth. MD and RN updated. CSW will continue to follow.   Darden Dates, MSW, LCSW  Clinical Social Worker  [phone]

## 2016-07-23 NOTE — Clinical Social Work Note (Signed)
Pt is ready for discharge today and will to [facility]. [employer] Auth:  [id number]. Pt and friend, Karena Addison, are aware and agreeable to discharge. Facility has received discharge information. RN will call report. [facility] will provide transportation. CSW is signing off as no further needs identified.   Dede Query, MSW, LCSW Clinical Social Worker  [phone]

## 2016-07-23 NOTE — Care Management Note (Signed)
Case Management Note  Patient Details  Name: Olivia Butler MRN: [id number] Date of Birth: December 23, 1955  Subjective/Objective:        Spoke to patient per MD request. I also spoke to [name] [phone]   . She is the person who helps take care of this lady, and is homebound right now after having knee surgery. She gave me the patient insurance inf, which I asked patient access to add to the chart.She has a Health visitor that is active. We are awaiting CSW.         Action/Plan:   Expected Discharge Date:                  Expected Discharge Plan:     In-House Referral:     Discharge planning Services     Post Acute Care Choice:    Choice offered to:     DME Arranged:    DME Agency:     HH Arranged:    HH Agency:     Status of Service:     If discussed at [employer] of Stay Meetings, dates discussed:    Additional Comments:  Berna Bue, RN 07/23/2016, 10:20 AM

## 2016-07-23 NOTE — ED Notes (Signed)
Secretary given medical necessity and discharge paperwork.  EMS transportation to be called for patient transport to [facility].

## 2016-07-23 NOTE — Evaluation (Deleted)
Physical Therapy Evaluation Patient Details Name: Olivia Butler MRN: [id number] DOB: 1955/09/03 Today's Date: 07/23/2016   History of Present Illness  61 y.o. female with a history of Hashimoto thyroiditis and degenerative cerebellar ataxia who presents for evaluation of a fall. Patient reports she lives alone. Her ataxia is been getting progressively worse over the course of months. She has a walker and a wheelchair but has had increased number of falls. She reports that she has been falling every day  Clinical Impression  Pt showed good effort and willingness to participate but was highly unsafe with attempt at standing and had severe buckling and unsteadiness getting into standing.  She is able to get to sitting EOB w/o direct assist, but ultimately is limited to this.  She reports she falls nearly every time she tries to get to her feet or transfer (onto w/c, commode, etc). Pt admits that she probably has not been safe a home for a while now and is eager to work with PT at rehab to get stronger but also realizes that she may ultimately need a higher level of care given her falls, progressing ataxia and functional limitations.     Follow Up Recommendations SNF    Equipment Recommendations       Recommendations for Other Services       Precautions / Restrictions Precautions Precautions: Fall Restrictions Weight Bearing Restrictions: No      Mobility  Bed Mobility Overal bed mobility: Modified Independent             General bed mobility comments: Pt did well getting herself to sitting EOB with rail use and extra time, but no addiditional assist  Transfers Overall transfer level: Needs assistance Equipment used: Rolling walker (2 wheeled) Transfers: Sit to/from Stand Sit to Stand: Max assist         General transfer comment: Pt needed assist to get to standing, and almost instantly had knee buckling/ataxic trunk movements and was generally highly unsafe and "crashed"  back into the bed.  Pt's HR increased to the 150s with the minimal effort and she felt exhausted with trying to stand  Ambulation/Gait             General Gait Details: unable and completely unsafe to try  Stairs            Wheelchair Mobility    Modified Rankin (Stroke Patients Only)       Balance Overall balance assessment: Needs assistance Sitting-balance support: No upper extremity supported Sitting balance-Leahy Scale: Good     Standing balance support: Bilateral upper extremity supported Standing balance-Leahy Scale: Zero Standing balance comment: Pt highly unsafe and unsteady in getting to/maintaining standing                             Pertinent Vitals/Pain Pain Assessment:  (only chronic pain, minimal acute pain from fall/bruising)    Home Living Family/patient expects to be discharged to:: Skilled nursing facility                      Prior Function Level of Independence: Needs assistance         Comments: Pt reports that she falls multiple times each day (anytime she tries to get in/out of the w/c) and has not been able to really walk in years     Hand Dominance        Extremity/Trunk Assessment   Upper Extremity  Assessment Upper Extremity Assessment: Overall WFL for tasks assessed    Lower Extremity Assessment Lower Extremity Assessment: Generalized weakness (decreased strength and coordination with all testing)       Communication   Communication: No difficulties  Cognition Arousal/Alertness: Awake/alert Behavior During Therapy: WFL for tasks assessed/performed Overall Cognitive Status: Within Functional Limits for tasks assessed                                        General Comments      Exercises     Assessment/Plan    PT Assessment Patient needs continued PT services  PT Problem List Decreased strength;Decreased range of motion;Decreased activity tolerance;Decreased  balance;Decreased mobility;Decreased coordination;Decreased knowledge of use of DME;Decreased safety awareness;Obesity       PT Treatment Interventions DME instruction;Gait training;Functional mobility training;Therapeutic activities;Therapeutic exercise;Balance training;Patient/family education;Neuromuscular re-education    PT Goals (Current goals can be found in the Care Plan section)  Acute Rehab PT Goals Patient Stated Goal: Pt wishes to go home, but realizes that she is not at all safe PT Goal Formulation: With patient Time For Goal Achievement: 08/06/16 Potential to Achieve Goals: Fair    Frequency Min 2X/week   Barriers to discharge        Co-evaluation               End of Session Equipment Utilized During Treatment: Gait belt Activity Tolerance: Patient limited by fatigue Patient left: with call bell/phone within reach;with bed alarm set Nurse Communication: Mobility status PT Visit Diagnosis: Muscle weakness (generalized) (M62.81);History of falling (Z91.81);Unsteadiness on feet (R26.81)    Time: 1610-9604 PT Time Calculation (min) (ACUTE ONLY): 23 min   Charges:   PT Evaluation $PT Eval Low Complexity: 1 Procedure     PT G Codes:        Malachi Pro, DPT 07/23/2016, 11:50 AM

## 2016-07-23 NOTE — Clinical Social Work Placement (Signed)
   CLINICAL SOCIAL WORK PLACEMENT  NOTE  Date:  07/23/2016  Patient Details  Name: Olivia Butler MRN: [id number] Date of Birth: 03/01/1956  Clinical Social Work is seeking post-discharge placement for this patient at the Skilled  Nursing Facility level of care (*CSW will initial, date and re-position this form in  chart as items are completed):  Yes   Patient/family provided with [facility] Clinical Social Work Department's list of facilities offering this level of care within the geographic area requested by the patient (or if unable, by the patient's family).  Yes   Patient/family informed of their freedom to choose among providers that offer the needed level of care, that participate in Medicare, Medicaid or managed care program needed by the patient, have an available bed and are willing to accept the patient.  Yes   Patient/family informed of [facility]'s ownership interest in [facility] and [facility], as well as of the fact that they are under no obligation to receive care at these facilities.  PASRR submitted to EDS on       PASRR number received on       Existing PASRR number confirmed on 07/23/16     FL2 transmitted to all facilities in geographic area requested by pt/family on 07/23/16     FL2 transmitted to all facilities within larger geographic area on       Patient informed that his/her managed care company has contracts with or will negotiate with certain facilities, including the following:        Yes   Patient/family informed of bed offers received.  Patient chooses bed at [facility]     Physician recommends and patient chooses bed at      Patient to be transferred to [facility] on 07/23/16.  Patient to be transferred to facility [facility]     Patient family notified on 07/23/16 of transfer.  Name of family member notified:  Pt's friend, Karena Addison     PHYSICIAN       Additional Comment:     _______________________________________________ Dede Query, LCSW 07/23/2016, 3:38 PM

## 2016-07-23 NOTE — ED Notes (Signed)
Social work at bedside with patient. 

## 2016-07-23 NOTE — Evaluation (Signed)
Physical Therapy Evaluation Patient Details Name: Olivia Butler MRN: [id number] DOB: 1956-01-21 Today's Date: 07/23/2016   History of Present Illness  61 y.o. female with a history of Hashimoto thyroiditis and degenerative cerebellar ataxia who presents for evaluation of a fall. Patient reports she lives alone. Her ataxia is been getting progressively worse over the course of months. She has a walker and a wheelchair but has had increased number of falls. She reports that she has been falling every day  Clinical Impression  Pt showed good effort and willingness to participate but was highly unsafe with attempt at standing and had severe buckling and unsteadiness getting into standing.  She is able to get to sitting EOB w/o direct assist, but ultimately is limited to this.  She reports she falls nearly every time she tries to get to her feet or transfer (onto w/c, commode, etc). Pt admits that she probably has not been safe a home for a while now and is eager to work with PT at rehab to get stronger but also realizes that she may ultimately need a higher level of care given her falls, progressing ataxia and functional limitations.     Follow Up Recommendations SNF    Equipment Recommendations       Recommendations for Other Services       Precautions / Restrictions Precautions Precautions: Fall Restrictions Weight Bearing Restrictions: No      Mobility  Bed Mobility Overal bed mobility: Modified Independent             General bed mobility comments: Pt did well getting herself to sitting EOB with rail use and extra time, but no addiditional assist  Transfers Overall transfer level: Needs assistance Equipment used: Rolling walker (2 wheeled) Transfers: Sit to/from Stand Sit to Stand: Max assist         General transfer comment: Pt needed assist to get to standing, and almost instantly had knee buckling/ataxic trunk movements and was generally highly unsafe and "crashed"  back into the bed.  Pt's HR increased to the 150s with the minimal effort and she felt exhausted with trying to stand  Ambulation/Gait             General Gait Details: unable and completely unsafe to try  Stairs            Wheelchair Mobility    Modified Rankin (Stroke Patients Only)       Balance Overall balance assessment: Needs assistance Sitting-balance support: No upper extremity supported Sitting balance-Leahy Scale: Good     Standing balance support: Bilateral upper extremity supported Standing balance-Leahy Scale: Zero Standing balance comment: Pt highly unsafe and unsteady in getting to/maintaining standing                             Pertinent Vitals/Pain Pain Assessment:  (only chronic pain, minimal acute pain from fall/bruising)    Home Living Family/patient expects to be discharged to:: Skilled nursing facility                      Prior Function Level of Independence: Needs assistance         Comments: Pt reports that she falls multiple times each day (anytime she tries to get in/out of the w/c) and has not been able to really walk in years     Hand Dominance        Extremity/Trunk Assessment   Upper Extremity  Assessment Upper Extremity Assessment: Overall WFL for tasks assessed    Lower Extremity Assessment Lower Extremity Assessment: Generalized weakness (decreased strength and coordination with all testing)       Communication   Communication: No difficulties  Cognition Arousal/Alertness: Awake/alert Behavior During Therapy: WFL for tasks assessed/performed Overall Cognitive Status: Within Functional Limits for tasks assessed                                        General Comments      Exercises     Assessment/Plan    PT Assessment Patient needs continued PT services  PT Problem List Decreased strength;Decreased range of motion;Decreased activity tolerance;Decreased  balance;Decreased mobility;Decreased coordination;Decreased knowledge of use of DME;Decreased safety awareness;Obesity       PT Treatment Interventions DME instruction;Gait training;Functional mobility training;Therapeutic activities;Therapeutic exercise;Balance training;Patient/family education;Neuromuscular re-education    PT Goals (Current goals can be found in the Care Plan section)  Acute Rehab PT Goals Patient Stated Goal: Pt wishes to go home, but realizes that she is not at all safe PT Goal Formulation: With patient Time For Goal Achievement: 08/06/16 Potential to Achieve Goals: Fair    Frequency Min 2X/week   Barriers to discharge        Co-evaluation               End of Session Equipment Utilized During Treatment: Gait belt Activity Tolerance: Patient limited by fatigue Patient left: with call bell/phone within reach;with bed alarm set Nurse Communication: Mobility status PT Visit Diagnosis: Muscle weakness (generalized) (M62.81);History of falling (Z91.81);Unsteadiness on feet (R26.81)    Time: 1610-9604 PT Time Calculation (min) (ACUTE ONLY): 23 min   Charges:   PT Evaluation $PT Eval Low Complexity: 1 Procedure     PT G Codes:   PT G-Codes **NOT FOR INPATIENT CLASS** Functional Assessment Tool Used: AM-PAC 6 Clicks Basic Mobility Functional Limitation: Mobility: Walking and moving around Mobility: Walking and Moving Around Current Status (V4098): 100 percent impaired, limited or restricted Mobility: Walking and Moving Around Goal Status (J1914): At least 80 percent but less than 100 percent impaired, limited or restricted    Malachi Pro, DPT 07/23/2016, 11:55 AM

## 2016-07-23 NOTE — NC FL2 (Signed)
  [place] MEDICAID FL2 LEVEL OF CARE SCREENING TOOL     IDENTIFICATION  Patient Name: Olivia Butler Birthdate: 10-28-1955 Sex: female Admission Date (Current Location): 07/22/2016  [facility] and [place] Number:  Chiropodist and Address:  [facility], [address] [place], [place] [address]      Provider Number: [id number]  Attending Physician Name and Address:  No att. providers found  Relative Name and Phone Number:       Current Level of Care: Hospital Recommended Level of Care: Skilled Nursing Facility Prior Approval Number:    Date Approved/Denied:   PASRR Number:    Discharge Plan: SNF    Current Diagnoses: Hashimoto thyroiditis Degenerative Cerebral Ataxia Hypertension Hypothyroidism  Orientation RESPIRATION BLADDER Height & Weight     Self, Time, Situation, Place  Normal Continent Weight: 285 lb (129.3 kg) Height:   (170.2 cm)  BEHAVIORAL SYMPTOMS/MOOD NEUROLOGICAL BOWEL NUTRITION STATUS   (none)  (none) Continent Diet (regular)  AMBULATORY STATUS COMMUNICATION OF NEEDS Skin   Extensive Assist Verbally Normal                       Personal Care Assistance Level of Assistance  Bathing, Dressing Bathing Assistance: Independent   Dressing Assistance: Independent     Functional Limitations Info  Sight Sight Info: Impaired        SPECIAL CARE FACTORS FREQUENCY  PT (By licensed PT)                    Contractures Contractures Info: Not present    Additional Factors Info  Code Status Code Status Info: full             Current Medications (07/23/2016):  This is the current hospital active medication list Current Facility-Administered Medications  Medication Dose Route Frequency Provider Last Rate Last Dose  . acetaminophen (TYLENOL) tablet 1,000 mg  1,000 mg Oral Q8H PRN Nita Sickle, MD      . hydrochlorothiazide (HYDRODIURIL) tablet 50 mg  50 mg Oral Daily Nita Sickle, MD    50 mg at 07/23/16 1053  . levothyroxine (SYNTHROID, LEVOTHROID) tablet 112 mcg  112 mcg Oral QAC breakfast Nita Sickle, MD   112 mcg at 07/23/16 0835  . losartan (COZAAR) tablet 100 mg  100 mg Oral Daily Nita Sickle, MD   100 mg at 07/23/16 1053   Current Outpatient Prescriptions  Medication Sig Dispense Refill  . hydrochlorothiazide (HYDRODIURIL) 25 MG tablet Take 50 mg by mouth daily.    Marland Kitchen levothyroxine (SYNTHROID, LEVOTHROID) 112 MCG tablet Take 112 mcg by mouth daily.    Marland Kitchen losartan (COZAAR) 100 MG tablet Take 100 mg by mouth daily.    . naproxen (NAPROSYN) 500 MG tablet Take 500 mg by mouth 2 (two) times daily with a meal.    . prochlorperazine (COMPAZINE) 10 MG tablet Take 10 mg by mouth every 8 (eight) hours as needed for nausea.    Marland Kitchen rOPINIRole (REQUIP) 1 MG tablet Take 1 mg by mouth 2 (two) times daily.    . vitamin E 400 UNIT capsule Take 400 Units by mouth daily.       Discharge Medications: Please see discharge summary for a list of discharge medications.  Relevant Imaging Results:  Relevant Lab Results:   Additional Information ss: [id number]  York Spaniel, LCSW

## 2016-07-24 DIAGNOSIS — K7469 Other cirrhosis of liver: Secondary | ICD-10-CM | POA: Diagnosis not present

## 2016-07-24 DIAGNOSIS — G119 Hereditary ataxia, unspecified: Secondary | ICD-10-CM | POA: Diagnosis not present

## 2016-07-24 DIAGNOSIS — R296 Repeated falls: Secondary | ICD-10-CM | POA: Diagnosis not present

## 2016-10-13 ENCOUNTER — Encounter (INDEPENDENT_AMBULATORY_CARE_PROVIDER_SITE_OTHER): Payer: Self-pay

## 2016-10-13 ENCOUNTER — Encounter: Payer: Self-pay | Admitting: *Deleted

## 2016-10-13 ENCOUNTER — Encounter: Payer: Self-pay | Admitting: Diagnostic Neuroimaging

## 2016-10-13 ENCOUNTER — Ambulatory Visit (INDEPENDENT_AMBULATORY_CARE_PROVIDER_SITE_OTHER): Payer: Medicare Other | Admitting: Diagnostic Neuroimaging

## 2016-10-13 VITALS — BP 94/55 | HR 101 | Ht 67.0 in

## 2016-10-13 DIAGNOSIS — G119 Hereditary ataxia, unspecified: Secondary | ICD-10-CM

## 2016-10-13 DIAGNOSIS — G252 Other specified forms of tremor: Secondary | ICD-10-CM

## 2016-10-13 NOTE — Progress Notes (Signed)
[facility]  PATIENT: Olivia Butler DOB: 02-Jun-1955  REFERRING CLINICIAN: Salley HewsMonica Mims, NP HISTORY FROM: patient (with brother and sister in law) REASON FOR VISIT: new consult    HISTORICAL  CHIEF COMPLAINT:  Chief Complaint  Patient presents with  . NP Mims  . Cerebellar Degenerative Disease    Increased Leg Tremors.  [facility]  Has had increase to requip and no change.  All the jerking started 2 mo ago.      HISTORY OF PRESENT ILLNESS:   61 year old female with history of unspecified chronic progressive cerebellar degeneration, here for evaluation of new onset tremor for past 2-3 months. Patient has history of restless leg syndrome, hypertension, hypothyroidism, obesity.  Patient has long history of "clumsiness" dizziness, nausea, worse with head movement. Patient had trouble with talking due to slurred speech. She had some trouble with choking on liquids and foods. Patient developed some tremor in her arm when she was in college. Patient has been a wheelchair for the past 25 years due to balance and gait difficulty. She was living independently until April 2018 when she fell down at home, went to the hospital, and due to significant weakness and balance difficulty was transferred to [facility]. From there she was [facility] and has been living there since that time.  [facility], patient reported increasing restless leg symptoms. Previously she was well controlled on ropinirole 1 mg twice a day. However due to increasing restless leg symptoms, this was gradually increased up to 2 mg twice a day and now currently 4 mg twice a day. Patient also had been on Compazine 10 mg at bedtime for nausea and "dizziness". This has been increased to 10 mg 3 times a day.  Around the same timeframe patient has been having increasing restless leg  symptoms in her legs, tremors, increasing anxiety, poor sleep, fatigue.  Apparently her medical doctors have been increasing ropinirole, Compazine, as well as starting anxiety medication treatment with Zoloft, sertraline and trazodone to help these symptoms, but unfortunately tremor symptoms have continued.    REVIEW OF SYSTEMS: Full 14 system review of systems performed and negative with exception of: Fatigue trouble swallowing urination problems.  ALLERGIES: Allergies  Allergen Reactions  . Statins Swelling  . Penicillin G Other (See Comments)    HOME MEDICATIONS: Outpatient Medications Prior to Visit  Medication Sig Dispense Refill  . hydrochlorothiazide (HYDRODIURIL) 25 MG tablet Take 50 mg by mouth daily.    Marland Kitchen. levothyroxine (SYNTHROID, LEVOTHROID) 112 MCG tablet Take 112 mcg by mouth daily.    Marland Kitchen. LORazepam (ATIVAN) 1 MG tablet Take 1 mg by mouth at bedtime.    Marland Kitchen. losartan (COZAAR) 100 MG tablet Take 100 mg by mouth daily.    . naproxen (NAPROSYN) 500 MG tablet Take 500 mg by mouth 2 (two) times daily with a meal.    . prochlorperazine (COMPAZINE) 10 MG tablet Take 10 mg by mouth every 8 (eight) hours.     Marland Kitchen. rOPINIRole (REQUIP) 4 MG tablet Take 4 mg by mouth 2 (two) times daily.    . sertraline (ZOLOFT) 100 MG tablet Take 100 mg by mouth daily.     . vitamin E 400 UNIT capsule Take 400 Units by mouth daily.    Marland Kitchen. rOPINIRole (REQUIP) 1 MG tablet Take 1 mg by mouth 2 (two) times daily.     No facility-administered medications prior to  visit.     PAST MEDICAL HISTORY: Past Medical History:  Diagnosis Date  . Cerebellar ataxia (HCC)   . HLD (hyperlipidemia)   . Hypertension   . Hypothyroidism   . Liver cirrhosis (HCC)   . Macular degeneration   . Morbid obesity (HCC)   . Osteoarthritis   . Restless leg syndrome   . Scoliosis   . Seasonal allergic rhinitis     PAST SURGICAL HISTORY: No past surgical history on file.  FAMILY HISTORY: No family history on file.  SOCIAL  HISTORY:  Social History   Social History  . Marital status: Single    Spouse name: N/A  . Number of children: N/A  . Years of education: N/A   Occupational History  . Not on file.   Social History Main Topics  . Smoking status: Never Smoker  . Smokeless tobacco: Never Used  . Alcohol use No  . Drug use: Unknown  . Sexual activity: Not on file   Other Topics Concern  . Not on file   Social History Narrative   Pt resides in [facility].       PHYSICAL EXAM  GENERAL EXAM/CONSTITUTIONAL: Vitals:  Vitals:   10/13/16 1421  BP: (!) 94/55  Pulse: (!) 101  Height: 5\' 7"  (1.702 m)     There is no height or weight on file to calculate BMI.  No exam data present  Patient is in no distress; well developed, nourished and groomed; neck is supple  SLIGHTLY LABORED BREATHING  CARDIOVASCULAR:  Examination of carotid arteries is normal; no carotid bruits  TACHYCARDIA --> regular rhythm, no murmurs  Examination of peripheral vascular system by observation and palpation is normal  EYES:  Ophthalmoscopic exam of optic discs and posterior segments is normal; no papilledema or hemorrhages  MUSCULOSKELETAL:  Gait, strength, tone, movements noted in Neurologic exam below  NEUROLOGIC: MENTAL STATUS:  No flowsheet data found.  awake, alert, oriented to person, place and time  [facility] memory   DECR attention and concentration  language fluent, comprehension intact, naming intact,   fund of knowledge appropriate  CRANIAL NERVE:   2nd - no papilledema on fundoscopic exam  2nd, 3rd, 4th, 6th - pupils equal and reactive to light, visual fields full to confrontation, extraocular muscles --> SACCADIC BREAKDOWN OF SMOOTH PURSUIT; LIMITED UPGAZE; FEW BEATS END GAZE NYSTAGMUS  5th - facial sensation symmetric  7th - facial strength symmetric  8th - hearing intact  9th - palate elevates symmetrically, uvula midline  11th - shoulder shrug  symmetric  12th - tongue protrusion midline  MILD DYSARTHRIA  MOTOR:   COARSE CONTINUOUS TREMORS AND AKATHESIA IN LEGS; SLIGHTLY LESS WITH DISTRACTION  MILD POSTURAL TREMOR IN BUE  normal bulk and tone, full strength in the BUE, BLE  SENSORY:   normal and symmetric to light touch  COORDINATION:   finger-nose-finger, fine finger movements --> ATAXIA IN LUE > RIGHT  REFLEXES:   deep tendon reflexes TRACE and symmetric  GAIT/STATION:   IN WHEEL CHAIR    DIAGNOSTIC DATA (LABS, IMAGING, TESTING) - I reviewed patient records, labs, notes, testing and imaging myself where available.  Lab Results  Component Value Date   WBC 9.1 07/22/2016   HGB 12.5 07/22/2016   HCT 36.3 07/22/2016   MCV 98.4 07/22/2016   PLT 178 07/22/2016      Component Value Date/Time   NA 137 07/22/2016 1531   K 3.9 07/22/2016 1531   CL 103 07/22/2016 1531  CO2 26 07/22/2016 1531   GLUCOSE 125 (H) 07/22/2016 1531   GLUCOSE 83 04/22/2006 1336   BUN 17 07/22/2016 1531   CREATININE 1.06 (H) 07/22/2016 1531   CALCIUM 8.9 07/22/2016 1531   PROT 7.1 04/22/2006 1336   ALBUMIN 4.2 04/22/2006 1336   AST 26 04/22/2006 1336   ALT 38 04/22/2006 1336   ALKPHOS 75 04/22/2006 1336   BILITOT 0.6 04/22/2006 1336   GFRNONAA 55 (L) 07/22/2016 1531   GFRAA >60 07/22/2016 1531   Lab Results  Component Value Date   CHOL 213 (HH) 04/22/2006   HDL 41.0 04/22/2006   LDLDIRECT 151.3 04/22/2006   TRIG 134 04/22/2006   CHOLHDL 5.2 CALC 04/22/2006   No results found for: HGBA1C No results found for: VITAMINB12 No results found for: TSH   07/22/16 CT LUMBAR SPINE 1. No acute traumatic injury within the lumbar spine. 2. Scoliosis with mild multilevel degenerative spondylolysis as above.  07/22/16 CT head  - Generalized atrophy particularly of the cerebellum. - Minimal small vessel chronic ischemic changes of deep cerebral white matter. - No acute intracranial abnormalities.      ASSESSMENT AND  PLAN  61 y.o. year old female here with history of unspecified chronic cerebellar degeneration syndrome, restless leg syndrome, obesity, hypertension, hypothyroidism, with new onset of increasing restless legs and akathisia in lower extremities and past 2-3 months, ever since patient fell down and was admitted to skilled nursing facility in April 2018. During this time medications have been adjusted. This may represent progression of restless leg syndrome, anxiety syndrome, or medication side effect from excessive ropinirole and Compazine.   Dx:   1. Coarse tremors   2. Cerebellar ataxia (HCC)      PLAN:  TREMORS - reduce medications back to home doses (when patient was doing better from tremor standpoint)  - reduce compazine to 10mg  at bedtime  - reduce ropinirole to 2mg  twice a day; after 2-4 weeks may reduce to 1mg  twice a day  CEREBELLAR ATAXIA DISEASE - supportive care  ANXIETY - continue sertraline, lorazepam and trazodone per PCP for anxiety  Return in about 6 weeks (around 11/24/2016) for 6-8 weeks.    Suanne Marker, MD 10/13/2016, 2:56 PM Certified in Neurology, Neurophysiology and Neuroimaging  [facility] [address] [place], [place] [address] [phone]

## 2016-10-13 NOTE — Patient Instructions (Signed)
  TREMORS - reduce medications back to home doses  - reduce compazine to 10mg  at bedtime  - reduce ropinirole to 2mg  twice a day; after 2-4 weeks may reduce to 1mg  twice a day  ANXIETY - continue sertraline, lorazepam and trazodone per PCP for anxiety

## 2016-10-16 ENCOUNTER — Telehealth: Payer: Self-pay | Admitting: Diagnostic Neuroimaging

## 2016-10-16 NOTE — Telephone Encounter (Signed)
Spoke to brother of pt.  She is in [facility] since 10-14-16 for bleeding ulcers (she has 8 , 2 bleeding)  Cauterized one on 7-4 and had [facility] (went thru groin)  cauterize bleed vessel that was feeding others on 7/5.   States pt has been off all meds).  She is in ICU, stable.  His question since off meds, can they stop the ropinirole totally or go to 1mg  now, and  stop compazine too? Aware to advise on Monday.

## 2016-10-16 NOTE — Telephone Encounter (Signed)
Pt brother ([place] calling to inform pt is in Hospital(since 7-4) and he wants to provide /discuss her status. Pt has 8 ulcers, he also wants to discuss medication of pt

## 2016-10-22 NOTE — Telephone Encounter (Signed)
I would defer decision to her hospital doctors for now. -VRP

## 2016-10-22 NOTE — Telephone Encounter (Signed)
Relayed via VM to brother, that Dr. Marjory LiesPenumalli would defer decision to her hospital physicians for now.  He is to call back if questions.

## 2016-11-03 ENCOUNTER — Telehealth: Payer: Self-pay | Admitting: Diagnostic Neuroimaging

## 2016-11-03 NOTE — Telephone Encounter (Signed)
Patient brother called to update us. Unfortunately patient had a GI bleeding, was admitted to hospital on 10/14/16, and complicated by pneumonia and organ failure. Patient passed away peacefully in hospice on 09/07/16.   Suanne MarkerVIKRAM R. PENUMALLI, MD 11/03/2016, 7:26 PM Certified in Neurology, Neurophysiology and Neuroimaging  [facility] [address], [address] [place], [place] [address] [phone]

## 2016-11-03 NOTE — Telephone Encounter (Signed)
Pt brother calling re: the appointment that is scheduled for the pt which recently passed.  Pt brother would like to know if the appointment could be kept and he utilize that time frame to speak with Dr Marjory LiesPenumalli re: the disease of the pt.  Please call to inform if he can or if he could just get a call from Dr Marjory LiesPenumalli re: the questions that he has about the disease the pt had

## 2016-11-09 NOTE — Telephone Encounter (Signed)
Patients brother Olivia Butler (listed on DPR) called office in reference to patient's diagnosis.  Olivia Butler stated he would like to confirm patient did not have Parkinson.  Please call

## 2016-11-11 DEATH — deceased

## 2016-11-12 ENCOUNTER — Ambulatory Visit: Payer: Medicare Other | Admitting: Neurology

## 2016-11-25 ENCOUNTER — Ambulatory Visit: Payer: Medicare Other | Admitting: Diagnostic Neuroimaging

## 2017-11-20 IMAGING — CT CT L SPINE W/O CM
3 of 4 series · 13 of 33 positions shown, 16 images · IV contrast (APPLIED)
Comparison: None.

CLINICAL DATA: Initial evaluation for acute trauma, fall.

EXAM:
CT ABDOMEN AND PELVIS WITH CONTRAST
CT LUMBAR SPINE WITHOUT CONTRAST
TECHNIQUE: Multidetector CT imaging of the abdomen and pelvis was performed
using the standard protocol following bolus administration of
intravenous contrast.
CONTRAST:  100mL [id number] IOPAMIDOL ([id number]) INJECTION 61%

[Series 10: l-spine multi space · axial · 0.33mm/px · z∈[-326,-111]mm · 5 of 346 slices shown, 7 images]
[im 39/346  soft-tissue]
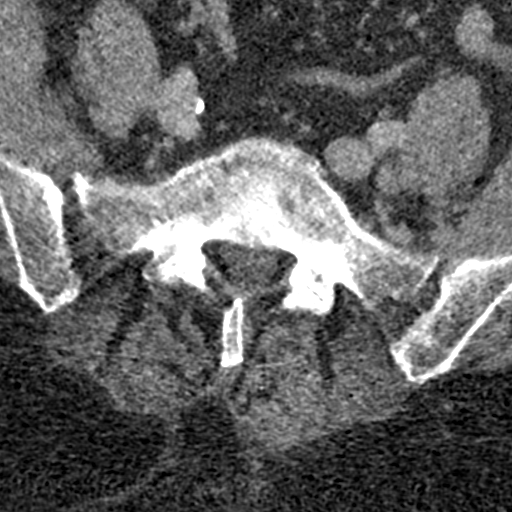
[im 39/346  bone]
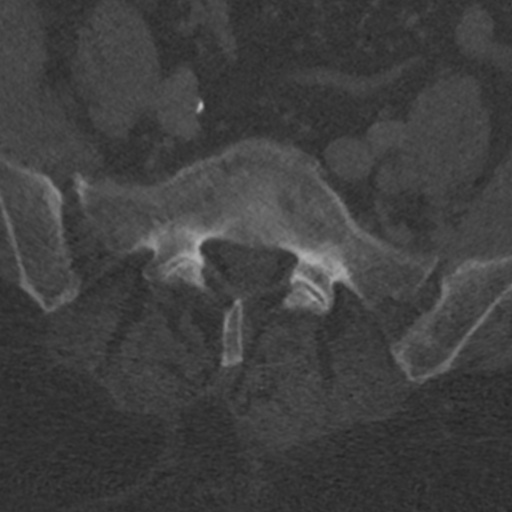
[im 116/346  bone]
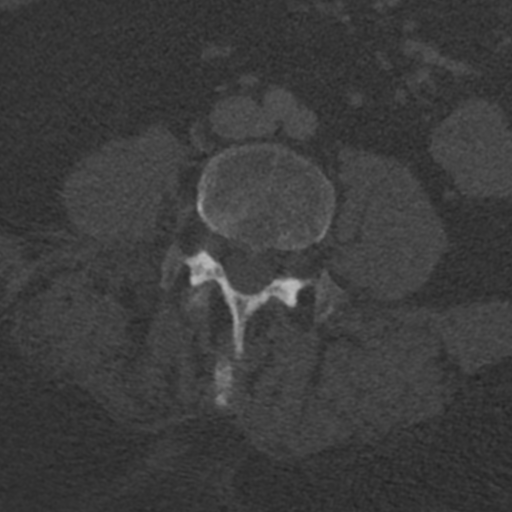
[im 192/346  bone]
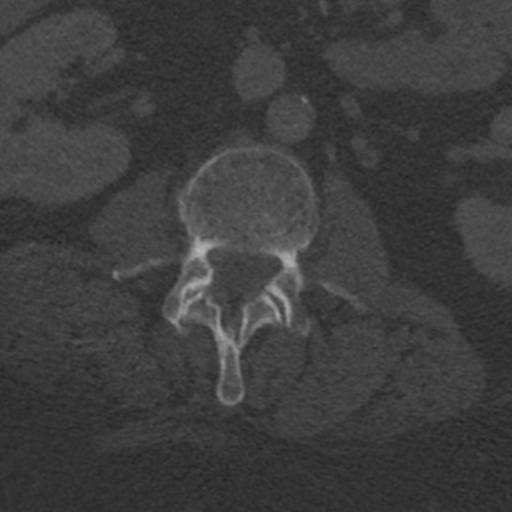
[im 231/346  bone]
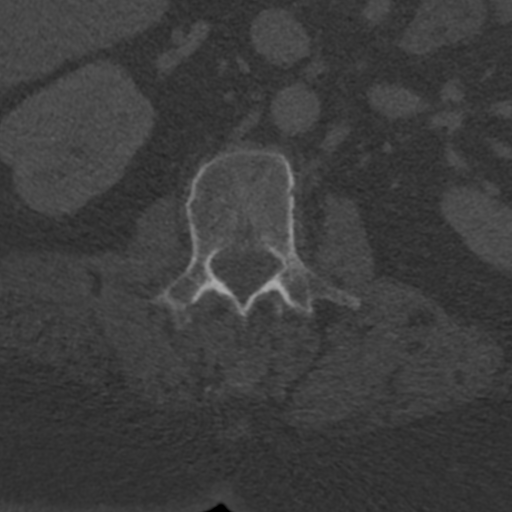
[im 307/346  soft-tissue]
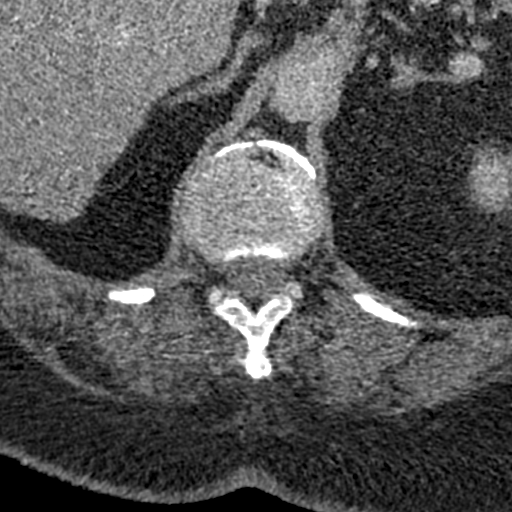
[im 307/346  bone]
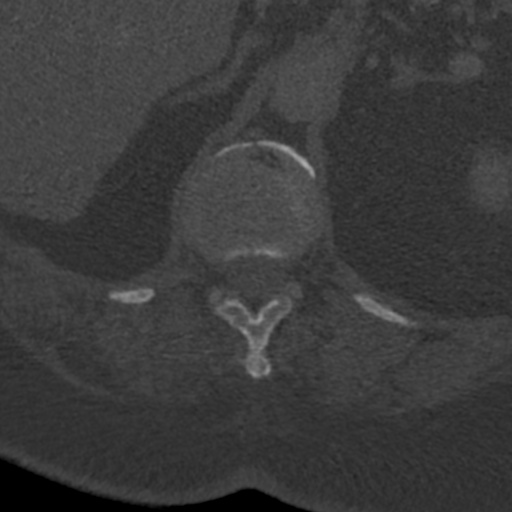

[Series 12: cor bone · coronal · 0.47mm/px · 3 of 91 slices shown]
[im 19/91  bone]
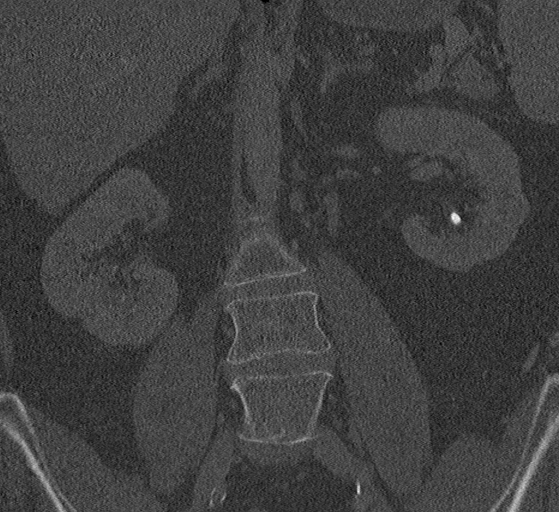
[im 37/91  bone]
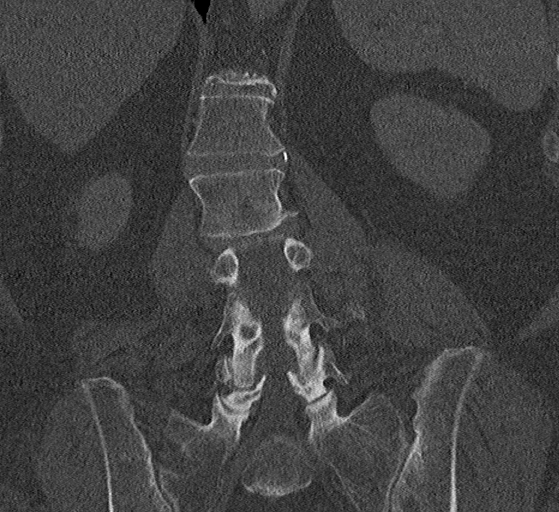
[im 55/91  bone]
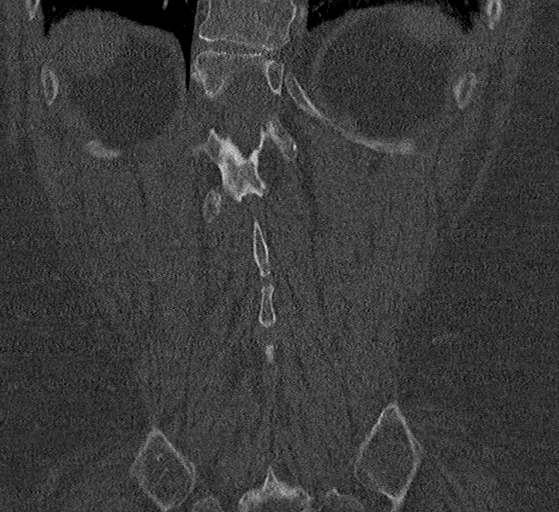

[Series 14: sag bone · sagittal · 0.42mm/px · 5 of 92 slices shown, 6 images]
[im 31/92  bone]
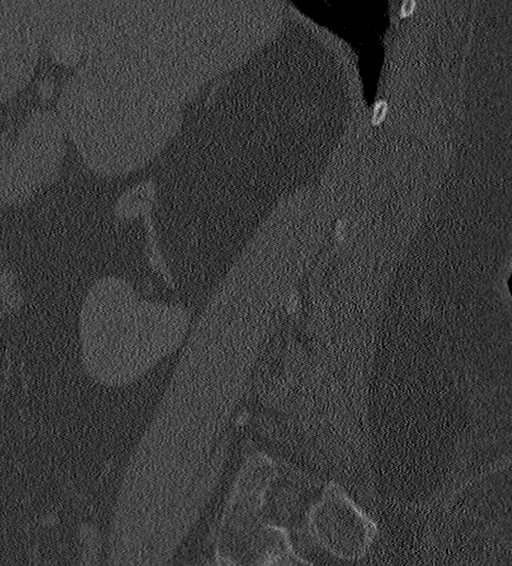
[im 38/92  bone]
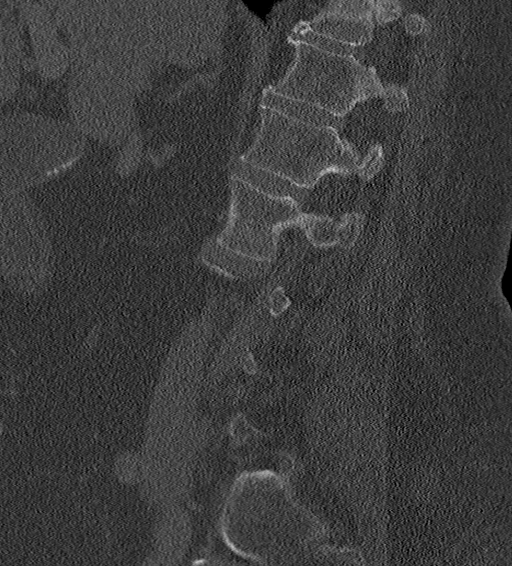
[im 46/92  soft-tissue]
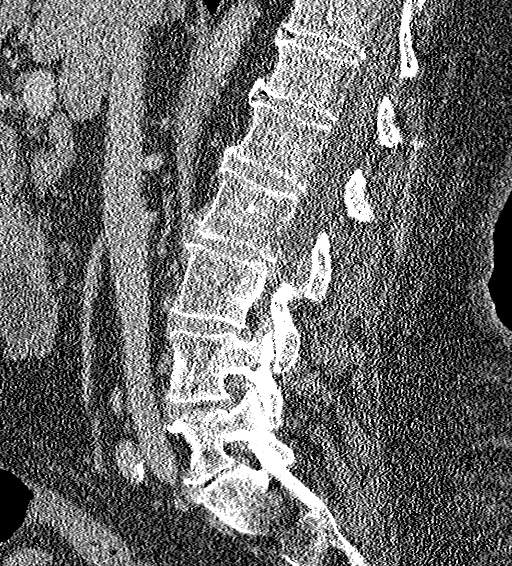
[im 46/92  bone]
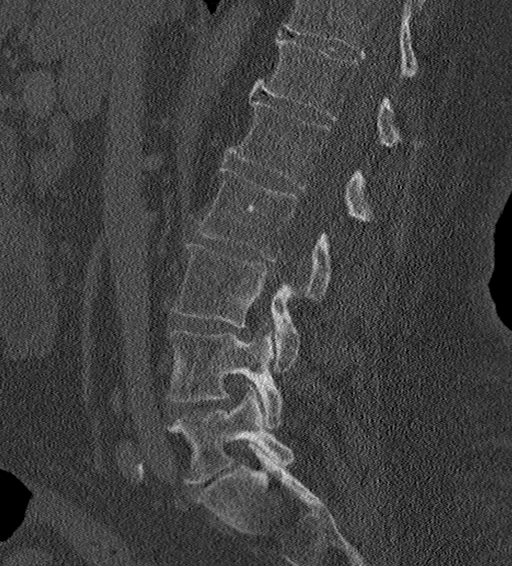
[im 54/92  bone]
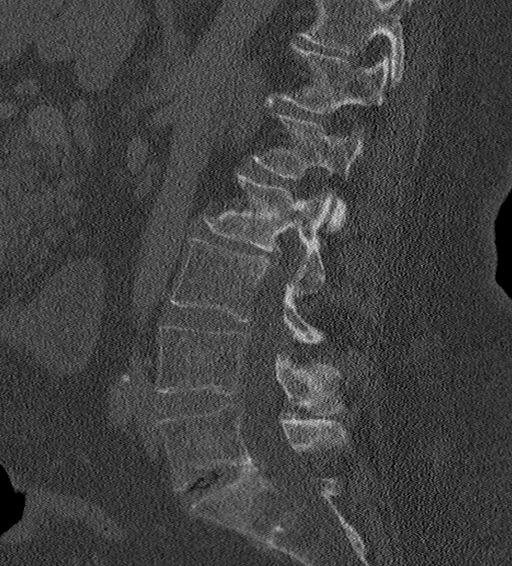
[im 61/92  bone]
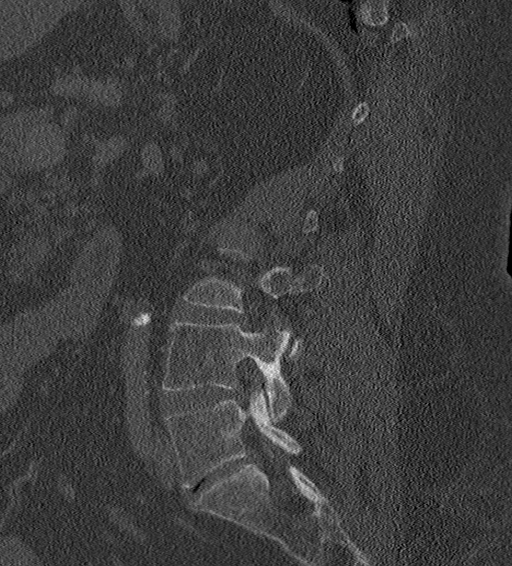

[13 of 33 positions shown; findings below may reference images not displayed]

FINDINGS: Lower chest: Scattered bibasilar atelectatic changes present
throughout the visualized lung bases. Visualized lungs are otherwise
grossly clear. No pleural pericardial effusion. Scattered coronary
artery calcifications partially visualized.

Hepatobiliary: Liver demonstrates a nodular contour, suggesting
cirrhosis. No focal intrahepatic lesions identified. Layering stones
present within the gallbladder lumen. No imaging findings to suggest
acute cholecystitis. No biliary dilatation.

Pancreas: Pancreas demonstrates no acute abnormality. Fatty
infiltration noted at the pancreatic head.

Spleen: Spleen within normal limits.

Adrenals/Urinary Tract: Adrenal glands are normal. Kidneys equal in
size with symmetric enhancement. 3.1 cm cyst present within the
lower pole the right kidney. Bilateral nonobstructive
nephrolithiasis present, measuring 4 mm on the right and 7 mm on the
left. No hydronephrosis. No focal enhancing renal mass. No
hydroureter. Partially distended bladder within normal limits.

Stomach/Bowel: Stomach within normal limits. No evidence for bowel
obstruction. No acute inflammatory changes seen about the bowels.

Vascular/Lymphatic: Moderate aorto bi-iliac atherosclerotic disease.
No aneurysm. No acute vascular abnormality within the abdomen and
pelvis.

Few mildly prominent porta hepatis nodes noted measuring up to 11
mm, likely related underlying intrinsic liver disease. Mildly
enlarged external iliac nodes measure up to 13 mm on the left and 11
mm on the right. Findings ir of uncertain etiology or significance.

Reproductive: Uterus and ovaries within normal limits for age.

Other: No free intraperitoneal air. No ascites or free fluid. No
mesenteric or retroperitoneal hematoma. Scattered vascular varices
noted within the left abdomen, suggesting a degree of underlying
portal hypertension. Small fat containing paraumbilical hernia
noted.

Musculoskeletal: Soft tissue hematoma measuring 5.8 x 10.3 x 10.0 cm
present within subcutaneous fat of the right gluteal region (series
2, image 84). Surrounding soft tissue stranding. Hematoma is largely
superficial to the right gluteus maximus musculature. No active
extravasation within this region. External soft tissues otherwise
unremarkable.

No acute osseous osseous abnormality within the pelvis. No worrisome
lytic or blastic osseous lesions.

CT LUMBAR SPINE:

Normal segmentation. Lowest well-formed disc labeled the L5-S1
level.

Scoliosis noted. Vertebral bodies otherwise normally aligned with
preservation of the normal lumbar lordosis.

Vertebral body heights are maintained.  No acute fracture.

Soft tissue hematoma partially visualized within the right gluteal
region, better evaluated on concomitant CT. Paraspinous soft tissues
demonstrate no other acute abnormality.

Mild multilevel degenerative spondylolysis noted. Degenerative disc
desiccation present at L2-3, L4-5, and L5-S1. Mild disc bulging at
L3-4 and L4-5. No significant stenosis.
IMPRESSION: CT ABDOMEN AND PELVIS IMPRESSION:

1. 5.8 x 10.3 x 10.0 cm soft tissue hematoma within the subcutaneous
fat of the right gluteal region.
2. No other acute traumatic injury within the abdomen and pelvis.
3. Cirrhosis with evidence of portal hypertension.  No ascites.
4. Mildly enlarged intra-abdominal and pelvic adenopathy as above,
indeterminate, but may be reactive.
5. Cholelithiasis.
6. Bilateral nonobstructive nephrolithiasis.
7. Moderate aorto bi-iliac atherosclerotic disease.
8. Fat containing paraumbilical hernia without associated
inflammation.

CT LUMBAR SPINE IMPRESSION:

1. No acute traumatic injury within the lumbar spine.
2. Scoliosis with mild multilevel degenerative spondylolysis as
above.

## 2017-11-20 IMAGING — CR DG CHEST 2V
2 series · 2 of 2 positions shown · non-contrast
Comparison: None.

CLINICAL DATA: Shortness of breath and dizziness

EXAM:
CHEST  2 VIEW

[chest lat]
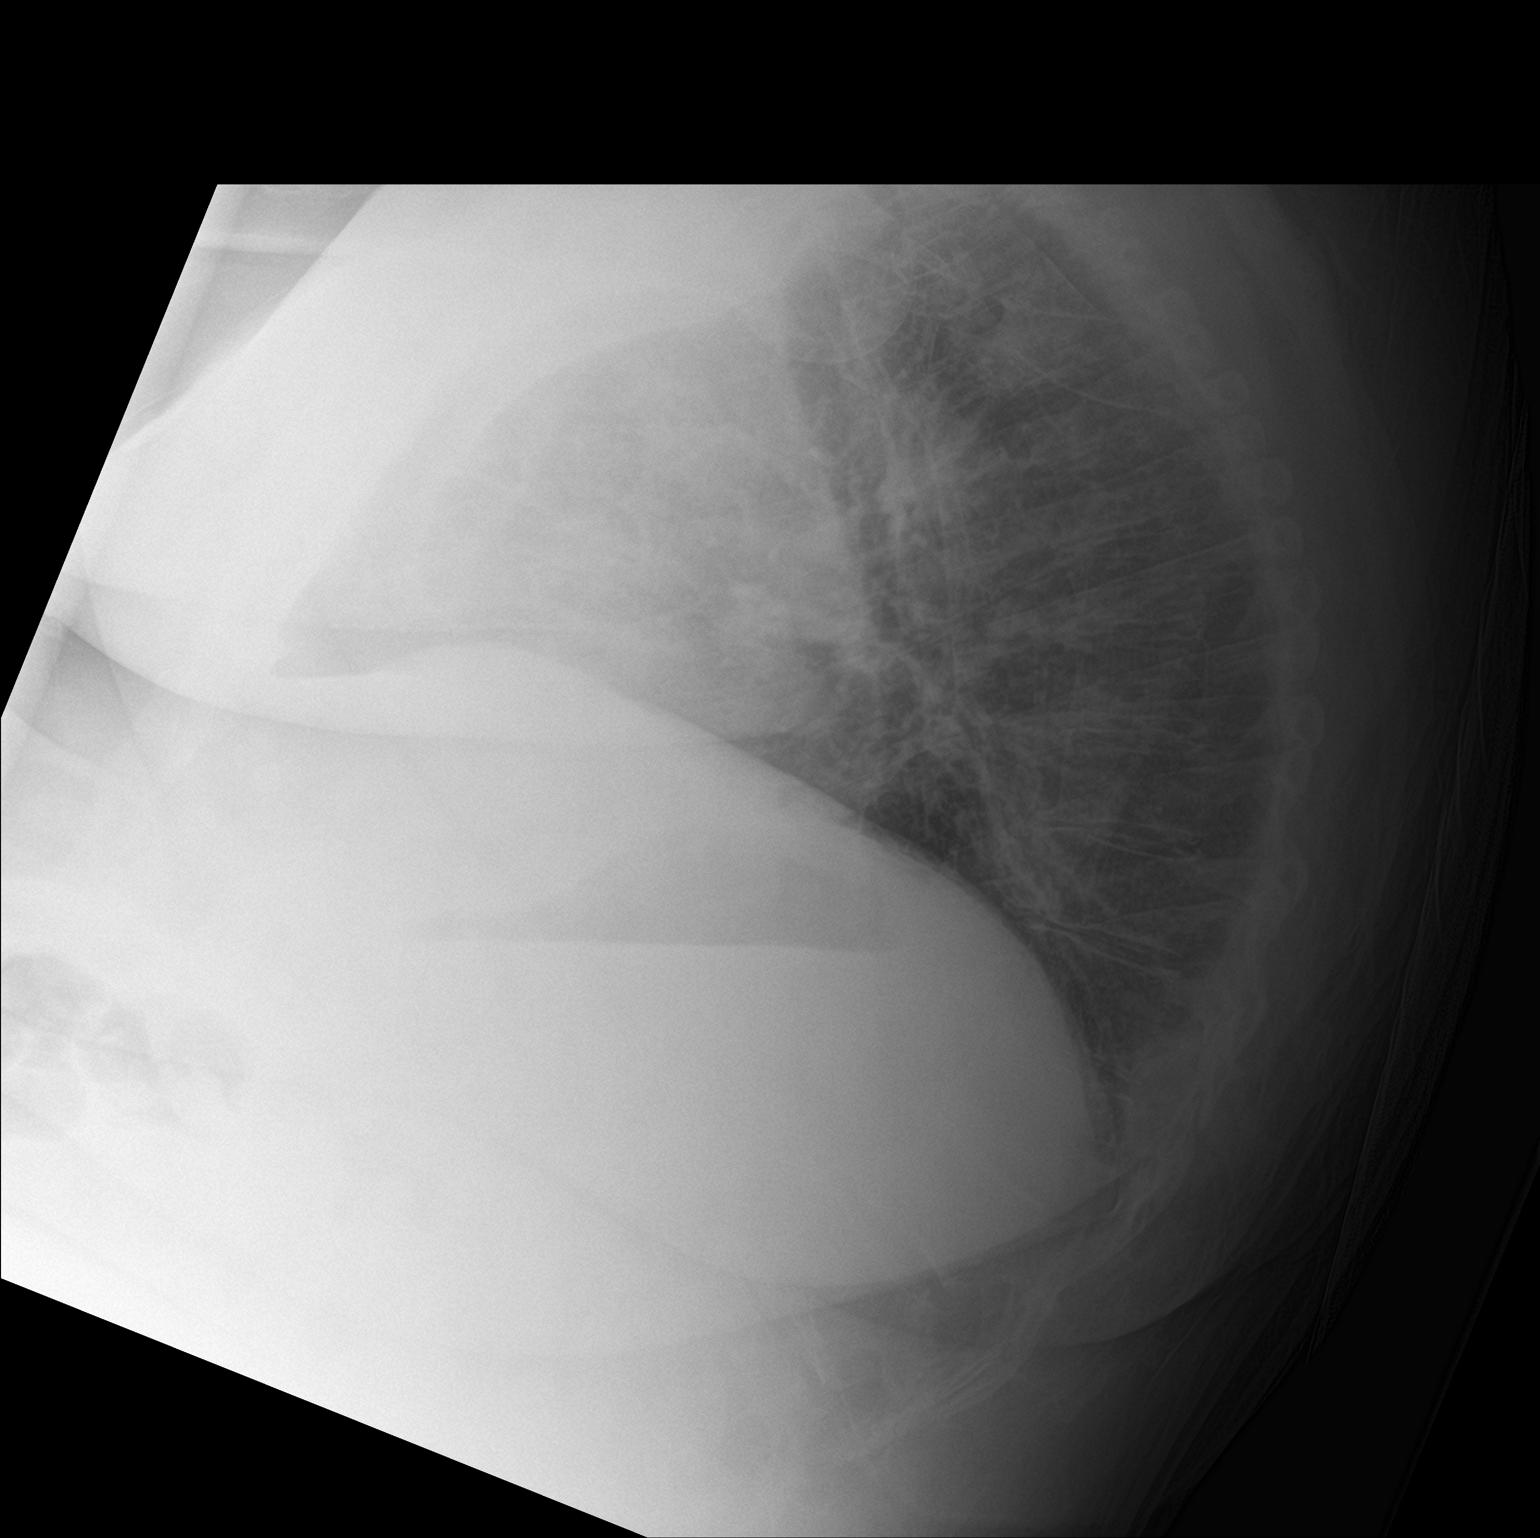

[chest ap]
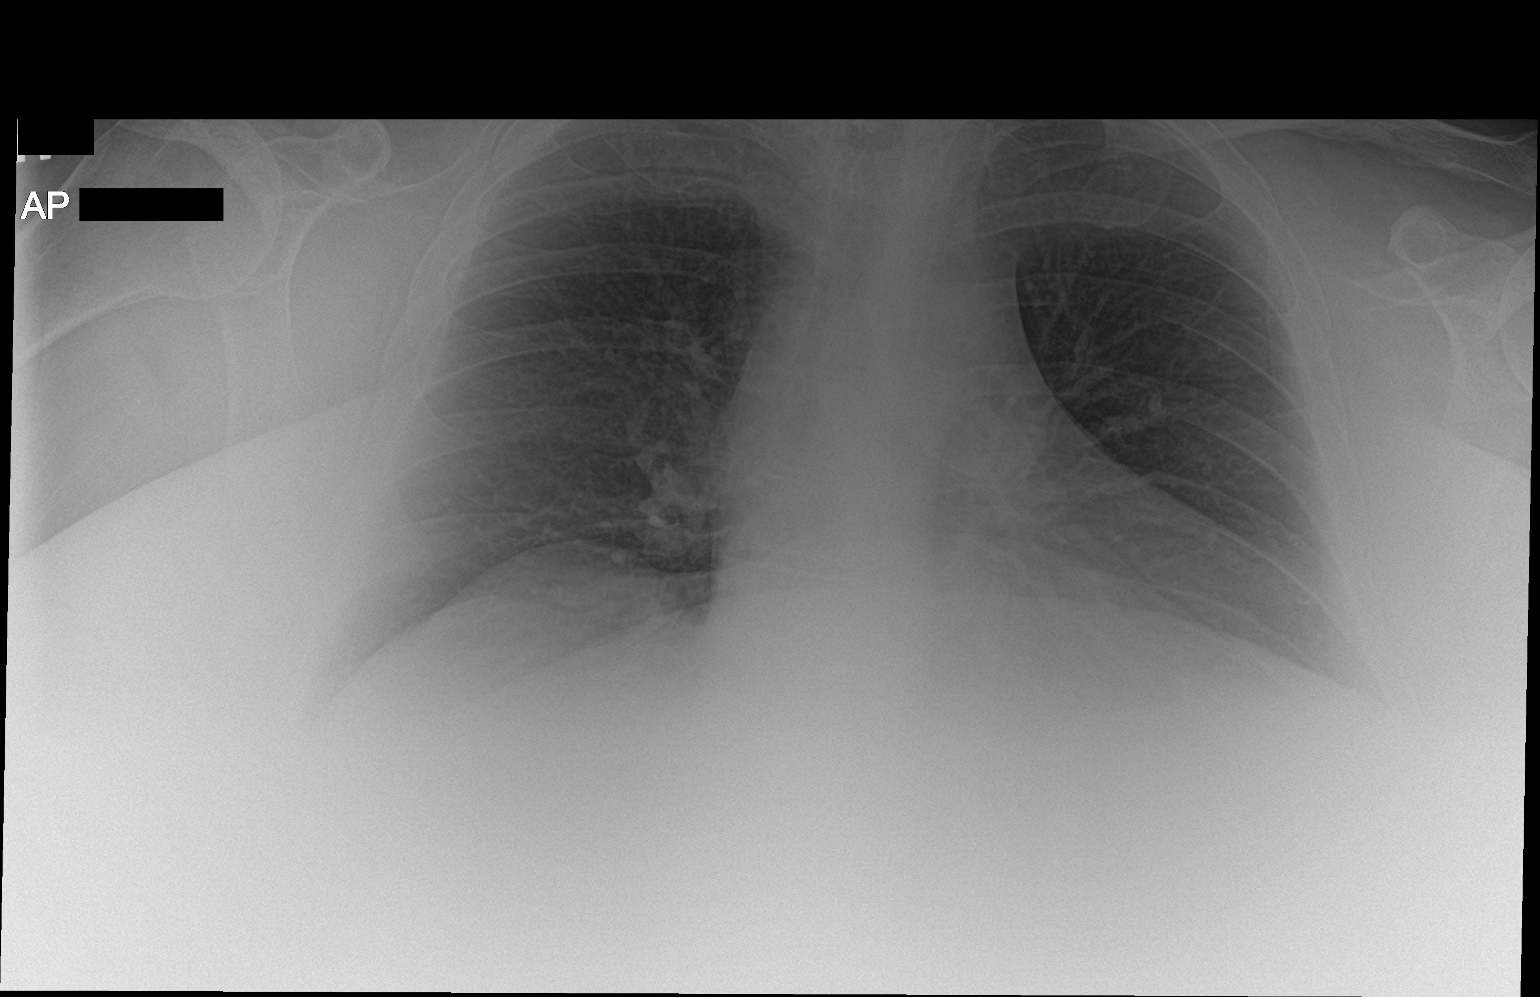

[2 of 2 positions shown; findings below may reference images not displayed]

FINDINGS: Lungs are clear. Heart is upper normal in size with pulmonary
vascularity within normal limits. No adenopathy. No pneumothorax. No
bone lesions.
IMPRESSION: No edema or consolidation.
